# Patient Record
Sex: Male | Born: 1990
Health system: Southern US, Community
[De-identification: ages and names within clinical notes are randomized; demographics above are authoritative.]

## PROBLEM LIST (undated history)

## (undated) DIAGNOSIS — Q249 Congenital malformation of heart, unspecified: Secondary | ICD-10-CM

## (undated) DIAGNOSIS — F1010 Alcohol abuse, uncomplicated: Secondary | ICD-10-CM

## (undated) DIAGNOSIS — I5022 Chronic systolic (congestive) heart failure: Secondary | ICD-10-CM

## (undated) DIAGNOSIS — Z720 Tobacco use: Secondary | ICD-10-CM

## (undated) DIAGNOSIS — F1210 Cannabis abuse, uncomplicated: Secondary | ICD-10-CM

## (undated) DIAGNOSIS — R55 Syncope and collapse: Secondary | ICD-10-CM

---

## 2018-01-06 ENCOUNTER — Emergency Department (HOSPITAL_COMMUNITY)
Admission: EM | Admit: 2018-01-06 | Discharge: 2018-01-06 | Disposition: A | Discharge status: Home / Self Care | Payer: Self-pay | Attending: Emergency Medicine | Admitting: Emergency Medicine

## 2018-01-06 ENCOUNTER — Other Ambulatory Visit: Payer: Self-pay

## 2018-01-06 ENCOUNTER — Encounter (HOSPITAL_COMMUNITY): Payer: Self-pay | Admitting: *Deleted

## 2018-01-06 DIAGNOSIS — F17210 Nicotine dependence, cigarettes, uncomplicated: Secondary | ICD-10-CM | POA: Insufficient documentation

## 2018-01-06 DIAGNOSIS — R319 Hematuria, unspecified: Secondary | ICD-10-CM

## 2018-01-06 LAB — URINALYSIS, ROUTINE W REFLEX MICROSCOPIC
Bilirubin Urine: NEGATIVE
GLUCOSE, UA: NEGATIVE mg/dL
Hgb urine dipstick: NEGATIVE
KETONES UR: NEGATIVE mg/dL
LEUKOCYTES UA: NEGATIVE
Nitrite: NEGATIVE
PROTEIN: NEGATIVE mg/dL
Specific Gravity, Urine: 1.015 (ref 1.005–1.030)
pH: 7 (ref 5.0–8.0)

## 2018-01-06 NOTE — Discharge Instructions (Addendum)
Call for the remaining lab results in 3-5 days (send out tests). Your urine today appears normal, no signs of blood or infection in the urine at this time. Follow up with primary care, referral given, if symptoms return.

## 2018-01-06 NOTE — ED Provider Notes (Signed)
[facility] EMERGENCY DEPARTMENT Provider Note   CSN: [id number] Arrival date & time: 01/06/18  0911     History   Chief Complaint Chief Complaint  Patient presents with  . Hematuria    HPI Jackob Crookston is a 27 y.o. male.  27 year old male presents with complaint of blood in his urine today.  Patient states that he woke up around 630 this morning and went to the bathroom, states that he felt like he was "peeing razor blades" looked in the toilet and saw a few drops of blood in the commode.  She has urinated twice since then, no further episodes of blood.  He denies abdominal pain, flank pain, urethral discharge, urgency/frequency, any further dysuria, testicular pain or swelling, rashes or lesions.  Patient is in a monogamous relationship, significant other is here with patient today (pregnant), no concerns for STDs.     History reviewed. No pertinent past medical history.  There are no active problems to display for this patient.   History reviewed. No pertinent surgical history.      Home Medications    Prior to Admission medications   Not on File    Family History History reviewed. No pertinent family history.  Social History Social History   Tobacco Use  . Smoking status: Current Every Day Smoker  . Smokeless tobacco: Never Used  Substance Use Topics  . Alcohol use: Not on file  . Drug use: Not on file     Allergies   Patient has no known allergies.   Review of Systems Review of Systems  Constitutional: Negative for fever.  Gastrointestinal: Negative for abdominal pain, nausea and vomiting.  Genitourinary: Positive for dysuria and hematuria. Negative for decreased urine volume, difficulty urinating, discharge, frequency, genital sores, penile pain, penile swelling, scrotal swelling, testicular pain and urgency.  Musculoskeletal: Negative for back pain.  Skin: Negative for rash and wound.  Allergic/Immunologic: Negative for  immunocompromised state.  Hematological: Negative for adenopathy.  Psychiatric/Behavioral: Negative for confusion.  All other systems reviewed and are negative.    Physical Exam Updated Vital Signs BP 122/84 (BP Location: Right Arm)   Pulse 99   Temp 98.3 F (36.8 C) (Oral)   Resp 20   SpO2 99%   Physical Exam  Constitutional: He is oriented to person, place, and time. He appears well-developed and well-nourished. No distress.  HENT:  Head: Normocephalic and atraumatic.  Cardiovascular: Intact distal pulses.  Pulmonary/Chest: Effort normal.  Abdominal: Soft. There is no tenderness. There is no guarding and no CVA tenderness.  Musculoskeletal: He exhibits no tenderness.  Neurological: He is alert and oriented to person, place, and time.  Skin: Skin is warm and dry. He is not diaphoretic.  Psychiatric: He has a normal mood and affect. His behavior is normal.  Nursing note and vitals reviewed.    ED Treatments / Results  Labs (all labs ordered are listed, but only abnormal results are displayed) Labs Reviewed  URINALYSIS, ROUTINE W REFLEX MICROSCOPIC  GC/CHLAMYDIA PROBE AMP ([place]) NOT AT [facility]    EKG None  Radiology No results found.  Procedures Procedures (including critical care time)  Medications Ordered in ED Medications - No data to display   Initial Impression / Assessment and Plan / ED Course  I have reviewed the triage vital signs and the nursing notes.  Pertinent labs & imaging results that were available during my care of the patient were reviewed by me and considered in my medical decision making (  see chart for details).  Clinical Course as of Jan 06 1045  Wed Jan 06, 2018  [id number] 27 year old male presents with complaint of a few drops in his urine this morning with painful urination.  Denies any symptoms at this time.  Exam is unremarkable.  Gonorrhea and Chlamydia testing sent out, urinalysis today is normal.  Patient given referral to primary  care for follow-up.  Advised to call the hospital for his test results in the next 3 to 5 days.   [LM]    Clinical Course User Index [LM] Jeannie Fend, PA-C   Final Clinical Impressions(s) / ED Diagnoses   Final diagnoses:  Hematuria, unspecified type    ED Discharge Orders    None       Jeannie Fend, PA-C 01/06/18 1046    Terrilee Files, MD 01/06/18 1859

## 2018-01-06 NOTE — ED Notes (Signed)
Declined W/C at D/C and was escorted to lobby by RN. 

## 2018-01-06 NOTE — ED Triage Notes (Signed)
Pt in c/o blood in his urine that first started this morning, denies penile discharge, reports he did have some pain with urination the first time he urinated this morning but not the second time

## 2018-01-07 LAB — GC/CHLAMYDIA PROBE AMP (~~LOC~~) NOT AT ARMC
CHLAMYDIA, DNA PROBE: NEGATIVE
Neisseria Gonorrhea: NEGATIVE

## 2018-05-27 ENCOUNTER — Emergency Department (HOSPITAL_COMMUNITY): Payer: Self-pay

## 2018-05-27 ENCOUNTER — Inpatient Hospital Stay (HOSPITAL_COMMUNITY)
Admission: EM | Admit: 2018-05-27 | Discharge: 2018-06-02 | DRG: 287 | Disposition: A | Discharge status: Home / Self Care | Payer: Self-pay | Attending: Internal Medicine | Admitting: Internal Medicine

## 2018-05-27 ENCOUNTER — Encounter (HOSPITAL_COMMUNITY): Payer: Self-pay | Admitting: Emergency Medicine

## 2018-05-27 DIAGNOSIS — R0902 Hypoxemia: Secondary | ICD-10-CM

## 2018-05-27 DIAGNOSIS — Z7151 Drug abuse counseling and surveillance of drug abuser: Secondary | ICD-10-CM

## 2018-05-27 DIAGNOSIS — F1290 Cannabis use, unspecified, uncomplicated: Secondary | ICD-10-CM | POA: Diagnosis present

## 2018-05-27 DIAGNOSIS — N179 Acute kidney failure, unspecified: Secondary | ICD-10-CM | POA: Diagnosis present

## 2018-05-27 DIAGNOSIS — I4729 Other ventricular tachycardia: Secondary | ICD-10-CM

## 2018-05-27 DIAGNOSIS — I5021 Acute systolic (congestive) heart failure: Secondary | ICD-10-CM | POA: Diagnosis present

## 2018-05-27 DIAGNOSIS — Z716 Tobacco abuse counseling: Secondary | ICD-10-CM

## 2018-05-27 DIAGNOSIS — I426 Alcoholic cardiomyopathy: Secondary | ICD-10-CM | POA: Diagnosis present

## 2018-05-27 DIAGNOSIS — I472 Ventricular tachycardia: Secondary | ICD-10-CM | POA: Diagnosis present

## 2018-05-27 DIAGNOSIS — Q249 Congenital malformation of heart, unspecified: Secondary | ICD-10-CM

## 2018-05-27 DIAGNOSIS — F17200 Nicotine dependence, unspecified, uncomplicated: Secondary | ICD-10-CM | POA: Diagnosis present

## 2018-05-27 DIAGNOSIS — J810 Acute pulmonary edema: Secondary | ICD-10-CM

## 2018-05-27 DIAGNOSIS — R1032 Left lower quadrant pain: Secondary | ICD-10-CM

## 2018-05-27 DIAGNOSIS — Z789 Other specified health status: Secondary | ICD-10-CM

## 2018-05-27 DIAGNOSIS — Z7289 Other problems related to lifestyle: Secondary | ICD-10-CM

## 2018-05-27 DIAGNOSIS — I5023 Acute on chronic systolic (congestive) heart failure: Principal | ICD-10-CM | POA: Diagnosis present

## 2018-05-27 DIAGNOSIS — F1020 Alcohol dependence, uncomplicated: Secondary | ICD-10-CM | POA: Diagnosis present

## 2018-05-27 DIAGNOSIS — I081 Rheumatic disorders of both mitral and tricuspid valves: Secondary | ICD-10-CM | POA: Diagnosis present

## 2018-05-27 HISTORY — DX: Alcohol abuse, uncomplicated: F10.10

## 2018-05-27 HISTORY — DX: Chronic systolic (congestive) heart failure: I50.22

## 2018-05-27 HISTORY — DX: Syncope and collapse: R55

## 2018-05-27 HISTORY — DX: Tobacco use: Z72.0

## 2018-05-27 HISTORY — DX: Congenital malformation of heart, unspecified: Q24.9

## 2018-05-27 HISTORY — DX: Cannabis abuse, uncomplicated: F12.10

## 2018-05-27 LAB — LIPASE, BLOOD: Lipase: 34 U/L (ref 11–51)

## 2018-05-27 LAB — COMPREHENSIVE METABOLIC PANEL
ALK PHOS: 52 U/L (ref 38–126)
ALT: 53 U/L — ABNORMAL HIGH (ref 0–44)
AST: 29 U/L (ref 15–41)
Albumin: 3.4 g/dL — ABNORMAL LOW (ref 3.5–5.0)
Anion gap: 10 (ref 5–15)
BILIRUBIN TOTAL: 0.9 mg/dL (ref 0.3–1.2)
BUN: 14 mg/dL (ref 6–20)
CHLORIDE: 105 mmol/L (ref 98–111)
CO2: 21 mmol/L — AB (ref 22–32)
CREATININE: 1.46 mg/dL — AB (ref 0.61–1.24)
Calcium: 8.8 mg/dL — ABNORMAL LOW (ref 8.9–10.3)
GFR calc Af Amer: >60 mL/min (ref >60)
GFR calc non Af Amer: >60 mL/min (ref >60)
Glucose, Bld: 113 mg/dL — ABNORMAL HIGH (ref 70–99)
Potassium: 4.5 mmol/L (ref 3.5–5.1)
Sodium: 136 mmol/L (ref 135–145)
Total Protein: 6 g/dL — ABNORMAL LOW (ref 6.5–8.1)

## 2018-05-27 LAB — URINALYSIS, ROUTINE W REFLEX MICROSCOPIC
Bilirubin Urine: NEGATIVE
Glucose, UA: NEGATIVE mg/dL
Hgb urine dipstick: NEGATIVE
KETONES UR: NEGATIVE mg/dL
LEUKOCYTE UA: NEGATIVE
Nitrite: NEGATIVE
PH: 5 (ref 5.0–8.0)
Protein, ur: 30 mg/dL — AB
Specific Gravity, Urine: 1.029 (ref 1.005–1.030)

## 2018-05-27 LAB — CBC
HEMATOCRIT: 43.5 % (ref 39.0–52.0)
Hemoglobin: 14.6 g/dL (ref 13.0–17.0)
MCH: 29.7 pg (ref 26.0–34.0)
MCHC: 33.6 g/dL (ref 30.0–36.0)
MCV: 88.4 fL (ref 80.0–100.0)
NRBC: 0 % (ref 0.0–0.2)
Platelets: 213 10*3/uL (ref 150–400)
RBC: 4.92 MIL/uL (ref 4.22–5.81)
RDW: 12.9 % (ref 11.5–15.5)
WBC: 10.6 10*3/uL — ABNORMAL HIGH (ref 4.0–10.5)

## 2018-05-27 MED ORDER — FUROSEMIDE 10 MG/ML IJ SOLN
60.0000 mg | Freq: Once | INTRAMUSCULAR | Status: AC
  Administered 2018-05-27: 60 mg via INTRAVENOUS
  Filled 2018-05-27: qty 6

## 2018-05-27 MED ORDER — ONDANSETRON HCL 4 MG/2ML IJ SOLN
4.0000 mg | Freq: Once | INTRAMUSCULAR | Status: AC
  Administered 2018-05-27: 4 mg via INTRAVENOUS
  Filled 2018-05-27: qty 2

## 2018-05-27 MED ORDER — MORPHINE SULFATE (PF) 4 MG/ML IV SOLN
4.0000 mg | Freq: Once | INTRAVENOUS | Status: AC
  Administered 2018-05-27: 4 mg via INTRAVENOUS
  Filled 2018-05-27: qty 1

## 2018-05-27 MED ORDER — IOHEXOL 300 MG/ML  SOLN
100.0000 mL | Freq: Once | INTRAMUSCULAR | Status: AC | PRN
  Administered 2018-05-27: 100 mL via INTRAVENOUS

## 2018-05-27 MED ORDER — SODIUM CHLORIDE 0.9 % IV BOLUS
500.0000 mL | Freq: Once | INTRAVENOUS | Status: AC
  Administered 2018-05-27: 22:00:00 via INTRAVENOUS

## 2018-05-27 MED ORDER — DICYCLOMINE HCL 10 MG PO CAPS
10.0000 mg | ORAL_CAPSULE | Freq: Once | ORAL | Status: AC
  Administered 2018-05-27: 10 mg via ORAL
  Filled 2018-05-27: qty 1

## 2018-05-27 MED ORDER — SODIUM CHLORIDE 0.9% FLUSH: 3.0000 mL | Freq: Once | INTRAVENOUS | Status: DC

## 2018-05-27 NOTE — ED Notes (Signed)
Patient transported to CT scan . 

## 2018-05-27 NOTE — ED Triage Notes (Signed)
Pt reports a "mean stomach cramp" that has comes and goes for the past 4 days.  Denies n/v/d.  Pain makes me "bend over and stay".

## 2018-05-27 NOTE — ED Provider Notes (Signed)
Emergency Department Provider Note   I have reviewed the triage vital signs and the nursing notes.   HISTORY  Chief Complaint Abdominal Pain   HPI Bruce Morse is a 28 y.o. male presents to the emergency department for evaluation of intermittent, lower abdominal pain.  Patient describes severe pain which begins without provocation.  He has had some bowel movements which are more softer than normal but denies diarrhea.  No blood in the stool.  No nausea or vomiting.  Denies fevers or chills.  He reports feeling like "I am getting punched in the gut."  He is having pain currently.  No similar pain in the past.  Denies chest pain or shortness of breath.   History reviewed. No pertinent past medical history.  Patient Active Problem List   Diagnosis Date Noted  . Acute CHF (congestive heart failure) (HCC) 05/28/2018  . Left lower quadrant abdominal pain 05/28/2018  . Acute pulmonary edema (HCC) 05/28/2018    History reviewed. No pertinent surgical history.  Allergies Patient has no known allergies.  Family History  Problem Relation Age of Onset  . CAD Neg Hx   . Diabetes Mellitus II Neg Hx     Social History Social History   Tobacco Use  . Smoking status: Current Every Day Smoker  . Smokeless tobacco: Never Used  Substance Use Topics  . Alcohol use: Yes    Comment: Daily.  . Drug use: Not on file    Review of Systems  Constitutional: No fever/chills Eyes: No visual changes. ENT: No sore throat. Cardiovascular: Denies chest pain. Respiratory: Denies shortness of breath. Gastrointestinal: Positive abdominal pain.  No nausea, no vomiting.  No diarrhea.  No constipation. Genitourinary: Negative for dysuria. Musculoskeletal: Negative for back pain. Skin: Negative for rash. Neurological: Negative for headaches, focal weakness or numbness.  10-point ROS otherwise negative.  ____________________________________________   PHYSICAL EXAM:  VITAL SIGNS: ED  Triage Vitals  Enc Vitals Group     BP 05/27/18 2003 116/71     Pulse Rate 05/27/18 2003 (!) 130     Resp 05/27/18 2003 18     Temp 05/27/18 2003 98.3 F (36.8 C)     Temp Source 05/27/18 2003 Oral     SpO2 05/27/18 2003 99 %     Weight 05/27/18 2010 160 lb (72.6 kg)     Height 05/27/18 2010  (1.727 m)     Pain Score 05/27/18 2010 10   onstitutional: Alert and oriented. Well appearing and in no acute distress. Eyes: Conjunctivae are normal.  Head: Atraumatic. Nose: No congestion/rhinnorhea. Mouth/Throat: Mucous membranes are moist.  Neck: No stridor.   Cardiovascular: Normal rate, regular rhythm. Good peripheral circulation. Grossly normal heart sounds.   Respiratory: Normal respiratory effort.  No retractions. Lungs CTAB. Gastrointestinal: Soft with focal LLQ tenderness to palpation with voluntary guarding. No rebound. No distention.  Musculoskeletal: No lower extremity tenderness nor edema. No gross deformities of extremities. Neurologic:  Normal speech and language. No gross focal neurologic deficits are appreciated.  Skin:  Skin is warm, dry and intact. No rash noted.  ____________________________________________   LABS (all labs ordered are listed, but only abnormal results are displayed)  Labs Reviewed  COMPREHENSIVE METABOLIC PANEL - Abnormal; Notable for the following components:      Result Value   CO2 21 (*)    Glucose, Bld 113 (*)    Creatinine, Ser 1.46 (*)    Calcium 8.8 (*)    Total Protein 6.0 (*)  Albumin 3.4 (*)    ALT 53 (*)    All other components within normal limits  CBC - Abnormal; Notable for the following components:   WBC 10.6 (*)    All other components within normal limits  URINALYSIS, ROUTINE W REFLEX MICROSCOPIC - Abnormal; Notable for the following components:   Color, Urine AMBER (*)    Protein, ur 30 (*)    Bacteria, UA RARE (*)    All other components within normal limits  BRAIN NATRIURETIC PEPTIDE - Abnormal; Notable for the  following components:   B Natriuretic Peptide 1,232.5 (*)    All other components within normal limits  RAPID URINE DRUG SCREEN, HOSP PERFORMED - Abnormal; Notable for the following components:   Opiates POSITIVE (*)    Tetrahydrocannabinol POSITIVE (*)    All other components within normal limits  BASIC METABOLIC PANEL - Abnormal; Notable for the following components:   CO2 18 (*)    Glucose, Bld 126 (*)    Creatinine, Ser 1.45 (*)    All other components within normal limits  LIPASE, BLOOD  TROPONIN I  TSH  CBC  MAGNESIUM  TROPONIN I  HIV ANTIBODY (ROUTINE TESTING W REFLEX)  TROPONIN I   ____________________________________________  RADIOLOGY  Dg Chest 2 View  Result Date: 05/27/2018 CLINICAL DATA:  Stomach pain EXAM: CHEST - 2 VIEW COMPARISON:  CT 05/27/2018 FINDINGS: Cardiomegaly with vascular congestion and bilateral interstitial and ground-glass opacity consistent with edema. No significant pleural effusion. No pneumothorax. IMPRESSION: Cardiomegaly with vascular congestion and diffuse bilateral interstitial and ground-glass opacity consistent with pulmonary edema Electronically Signed   By: Jasmine Pang M.D.   On: 05/27/2018 23:14   Ct Abdomen Pelvis W Contrast  Result Date: 05/27/2018 CLINICAL DATA:  Abdominal pain. EXAM: CT ABDOMEN AND PELVIS WITH CONTRAST TECHNIQUE: Multidetector CT imaging of the abdomen and pelvis was performed using the standard protocol following bolus administration of intravenous contrast. CONTRAST:  OMNIPAQUE IOHEXOL 300 MG/ML  SOLN COMPARISON:  None. FINDINGS: Lower chest: Geographic ground-glass opacities in the bases. Mild smooth septal thickening. Heart is prominent size for age. Hepatobiliary: No focal hepatic lesion, however examination is performed on arterial phase imaging which limits detailed parenchymal assessment. Gallbladder partially distended, no calcified stone. No biliary dilatation. Pancreas: No ductal dilatation or inflammation.  Spleen: Normal in size without focal abnormality. Adrenals/Urinary Tract: Normal adrenal glands. No hydronephrosis or perinephric edema. Homogeneous renal enhancement. Urinary bladder is nondistended and well evaluated. Stomach/Bowel: Bowel evaluation is limited in the absence of enteric contrast and paucity of intra-abdominal fat. Stomach is nondistended. Small amount of high-density ingested material in the stomach and duodenum. No small bowel dilatation, inflammation, or obstruction. Normal appendix, for example images 58-61 series 3. Moderate stool in the proximal colon the remainder the colon is decompressed which limits assessment, no obvious colonic wall thickening. No pericolonic inflammation. No visualize colonic diverticula. Vascular/Lymphatic: Imaging obtained in arterial phase. Abdominal aorta is normal in caliber. No adenopathy. Reproductive: Prostate is unremarkable. Other: No free air or free fluid. Musculoskeletal: There are no acute or suspicious osseous abnormalities. IMPRESSION: 1. No acute abnormality in the abdomen/pelvis. 2. Geographic ground-glass opacities in the lung bases with smooth septal thickening. Findings suggest pulmonary edema. Recommend correlation with chest radiograph. Electronically Signed   By: Narda Rutherford M.D.   On: 05/27/2018 22:22    ____________________________________________   PROCEDURES  Procedure(s) performed:   Procedures  CRITICAL CARE Performed by: Maia Plan Total critical care time: 35 minutes Critical  care time was exclusive of separately billable procedures and treating other patients. Critical care was necessary to treat or prevent imminent or life-threatening deterioration. Critical care was time spent personally by me on the following activities: development of treatment plan with patient and/or surrogate as well as nursing, discussions with consultants, evaluation of patient's response to treatment, examination of patient, obtaining  history from patient or surrogate, ordering and performing treatments and interventions, ordering and review of laboratory studies, ordering and review of radiographic studies, pulse oximetry and re-evaluation of patient's condition.  Alona Bene, MD Emergency Medicine  ____________________________________________   INITIAL IMPRESSION / ASSESSMENT AND PLAN / ED COURSE  Pertinent labs & imaging results that were available during my care of the patient were reviewed by me and considered in my medical decision making (see chart for details).  Patient presents to the emergency department for evaluation of left lower quadrant abdominal pain.  He has tenderness on exam.  Describes pain as 10/10.  Mild leukocytosis here.  Plan for IV fluids, pain/nausea medication, CT abdomen pelvis evaluate for possible diverticulitis/colitis.  No testicular pain or swelling.  Doubt torsion.  Lower suspicion for ureteral stone given reproducible tenderness on exam.   Labs are largely unremarkable.  CT scan shows no acute intra-abdominal process.  Does show some evidence of pulmonary edema on lower lung fields.  Recommends chest x-ray which was ordered.  I back to question the patient.  He is not having significant shortness of breath but states that as a child he was diagnosed with a "enlarged heart."  He has not been advised to follow closely with cardiology.  Denies chest pain.  Significant other at bedside states that he does occasionally wake up at night short of breath but that this has not been a regular occurrence.  He denies any lower extremity swelling.  BNP 1,200 with normal troponin. Plan for hospitalist admit for diuresis and ECHO.   Discussed patient's case with Hospitalist, Dr. Toniann Fail to request admission. Patient and family (if present) updated with plan. Care transferred to Hospitalist service.  I reviewed all nursing notes, vitals, pertinent old records, EKGs, labs, imaging (as  available).  ____________________________________________  FINAL CLINICAL IMPRESSION(S) / ED DIAGNOSES  Final diagnoses:  Left lower quadrant abdominal pain  Hypoxemia  Acute pulmonary edema (HCC)     MEDICATIONS GIVEN DURING THIS VISIT:  Medications  acetaminophen (TYLENOL) tablet 650 mg (650 mg Oral Given 05/28/18 0345)    Or  acetaminophen (TYLENOL) suppository 650 mg ( Rectal See Alternative 05/28/18 0345)  ondansetron (ZOFRAN) tablet 4 mg ( Oral See Alternative 05/28/18 0345)    Or  ondansetron (ZOFRAN) injection 4 mg (4 mg Intravenous Given 05/28/18 0345)  LORazepam (ATIVAN) tablet 1 mg (has no administration in time range)    Or  LORazepam (ATIVAN) injection 1 mg (has no administration in time range)  thiamine (VITAMIN B-1) tablet 100 mg (100 mg Oral Given 05/28/18 0925)    Or  thiamine (B-1) injection 100 mg ( Intravenous See Alternative 05/28/18 0925)  folic acid (FOLVITE) tablet 1 mg (1 mg Oral Given 05/28/18 0925)  multivitamin with minerals tablet 1 tablet (1 tablet Oral Given 05/28/18 0925)  furosemide (LASIX) injection 40 mg (40 mg Intravenous Given 05/28/18 0925)  pantoprazole (PROTONIX) EC tablet 40 mg (40 mg Oral Given 05/28/18 0925)  morphine 4 MG/ML injection 4 mg (4 mg Intravenous Given 05/27/18 2211)  ondansetron (ZOFRAN) injection 4 mg (4 mg Intravenous Given 05/27/18 2211)  sodium chloride 0.9 %  bolus 500 mL (0 mLs Intravenous Stopped 05/27/18 2305)  iohexol (OMNIPAQUE) 300 MG/ML solution 100 mL (100 mLs Intravenous Contrast Given 05/27/18 2156)  dicyclomine (BENTYL) capsule 10 mg (10 mg Oral Given 05/27/18 2310)  furosemide (LASIX) injection 60 mg (60 mg Intravenous Given 05/27/18 2334)  metoprolol tartrate (LOPRESSOR) injection 5 mg (5 mg Intravenous Given 05/28/18 0323)    Note:  This document was prepared using Dragon voice recognition software and may include unintentional dictation errors.  Alona Bene, MD Emergency Medicine    , Arlyss Repress, MD 05/28/18 1106

## 2018-05-27 NOTE — ED Notes (Signed)
Pt. Transported to xray 

## 2018-05-28 ENCOUNTER — Inpatient Hospital Stay (HOSPITAL_COMMUNITY): Payer: Self-pay

## 2018-05-28 ENCOUNTER — Encounter (HOSPITAL_COMMUNITY): Payer: Self-pay | Admitting: Internal Medicine

## 2018-05-28 DIAGNOSIS — J810 Acute pulmonary edema: Secondary | ICD-10-CM

## 2018-05-28 DIAGNOSIS — I340 Nonrheumatic mitral (valve) insufficiency: Secondary | ICD-10-CM

## 2018-05-28 DIAGNOSIS — I5021 Acute systolic (congestive) heart failure: Secondary | ICD-10-CM | POA: Diagnosis present

## 2018-05-28 DIAGNOSIS — I361 Nonrheumatic tricuspid (valve) insufficiency: Secondary | ICD-10-CM

## 2018-05-28 DIAGNOSIS — R1032 Left lower quadrant pain: Secondary | ICD-10-CM | POA: Diagnosis present

## 2018-05-28 DIAGNOSIS — I509 Heart failure, unspecified: Secondary | ICD-10-CM

## 2018-05-28 LAB — HIV ANTIBODY (ROUTINE TESTING W REFLEX): HIV Screen 4th Generation wRfx: NONREACTIVE

## 2018-05-28 LAB — CBC
HCT: 46.1 % (ref 39.0–52.0)
HEMOGLOBIN: 14.9 g/dL (ref 13.0–17.0)
MCH: 28.7 pg (ref 26.0–34.0)
MCHC: 32.3 g/dL (ref 30.0–36.0)
MCV: 88.7 fL (ref 80.0–100.0)
Platelets: 205 10*3/uL (ref 150–400)
RBC: 5.2 MIL/uL (ref 4.22–5.81)
RDW: 12.9 % (ref 11.5–15.5)
WBC: 9.1 10*3/uL (ref 4.0–10.5)
nRBC: 0 % (ref 0.0–0.2)

## 2018-05-28 LAB — TROPONIN I
Troponin I: <0.03 ng/mL (ref <0.03)
Troponin I: <0.03 ng/mL (ref <0.03)
Troponin I: <0.03 ng/mL (ref <0.03)

## 2018-05-28 LAB — BASIC METABOLIC PANEL
Anion gap: 10 (ref 5–15)
BUN: 15 mg/dL (ref 6–20)
CO2: 18 mmol/L — ABNORMAL LOW (ref 22–32)
Calcium: 8.9 mg/dL (ref 8.9–10.3)
Chloride: 107 mmol/L (ref 98–111)
Creatinine, Ser: 1.45 mg/dL — ABNORMAL HIGH (ref 0.61–1.24)
GFR calc Af Amer: >60 mL/min (ref >60)
GFR calc non Af Amer: >60 mL/min (ref >60)
Glucose, Bld: 126 mg/dL — ABNORMAL HIGH (ref 70–99)
POTASSIUM: 4.4 mmol/L (ref 3.5–5.1)
Sodium: 135 mmol/L (ref 135–145)

## 2018-05-28 LAB — MAGNESIUM: Magnesium: 2 mg/dL (ref 1.7–2.4)

## 2018-05-28 LAB — TSH: TSH: 3.393 u[IU]/mL (ref 0.350–4.500)

## 2018-05-28 LAB — RAPID URINE DRUG SCREEN, HOSP PERFORMED
AMPHETAMINES: NOT DETECTED
BARBITURATES: NOT DETECTED
BENZODIAZEPINES: NOT DETECTED
COCAINE: NOT DETECTED
Opiates: POSITIVE — AB
Tetrahydrocannabinol: POSITIVE — AB

## 2018-05-28 LAB — ECHOCARDIOGRAM COMPLETE
Height: 68 in
Weight: 2796.8 oz

## 2018-05-28 LAB — BRAIN NATRIURETIC PEPTIDE: B NATRIURETIC PEPTIDE 5: 1232.5 pg/mL — AB (ref 0.0–100.0)

## 2018-05-28 MED ORDER — LORAZEPAM 2 MG/ML IJ SOLN: 1.0000 mg | Freq: Four times a day (QID) | INTRAMUSCULAR | Status: DC | PRN

## 2018-05-28 MED ORDER — DIGOXIN 125 MCG PO TABS
0.2500 mg | ORAL_TABLET | Freq: Every day | ORAL | Status: DC
  Administered 2018-05-28 – 2018-06-02 (×6): 0.25 mg via ORAL
  Filled 2018-05-28 (×6): qty 2

## 2018-05-28 MED ORDER — SACUBITRIL-VALSARTAN 24-26 MG PO TABS
1.0000 | ORAL_TABLET | Freq: Two times a day (BID) | ORAL | Status: DC
  Filled 2018-05-28: qty 1

## 2018-05-28 MED ORDER — ONDANSETRON HCL 4 MG PO TABS: 4.0000 mg | ORAL_TABLET | Freq: Four times a day (QID) | ORAL | Status: DC | PRN

## 2018-05-28 MED ORDER — PANTOPRAZOLE SODIUM 40 MG PO TBEC
40.0000 mg | DELAYED_RELEASE_TABLET | Freq: Every day | ORAL | Status: DC
  Administered 2018-05-28 – 2018-06-02 (×6): 40 mg via ORAL
  Filled 2018-05-28 (×6): qty 1

## 2018-05-28 MED ORDER — FOLIC ACID 1 MG PO TABS
1.0000 mg | ORAL_TABLET | Freq: Every day | ORAL | Status: DC
  Administered 2018-05-28 – 2018-06-02 (×6): 1 mg via ORAL
  Filled 2018-05-28 (×7): qty 1

## 2018-05-28 MED ORDER — VITAMIN B-1 100 MG PO TABS
100.0000 mg | ORAL_TABLET | Freq: Every day | ORAL | Status: DC
  Administered 2018-05-28 – 2018-06-01 (×5): 100 mg via ORAL
  Filled 2018-05-28 (×8): qty 1

## 2018-05-28 MED ORDER — ACETAMINOPHEN 650 MG RE SUPP: 650.0000 mg | Freq: Four times a day (QID) | RECTAL | Status: DC | PRN

## 2018-05-28 MED ORDER — LOSARTAN POTASSIUM 25 MG PO TABS
25.0000 mg | ORAL_TABLET | Freq: Every day | ORAL | Status: DC
  Administered 2018-05-28: 25 mg via ORAL
  Filled 2018-05-28: qty 1

## 2018-05-28 MED ORDER — SPIRONOLACTONE 12.5 MG HALF TABLET
12.5000 mg | ORAL_TABLET | Freq: Every day | ORAL | Status: DC
  Administered 2018-05-28 – 2018-06-02 (×6): 12.5 mg via ORAL
  Filled 2018-05-28 (×7): qty 1

## 2018-05-28 MED ORDER — METOPROLOL TARTRATE 5 MG/5ML IV SOLN
5.0000 mg | Freq: Once | INTRAVENOUS | Status: AC
  Administered 2018-05-28: 5 mg via INTRAVENOUS
  Filled 2018-05-28: qty 5

## 2018-05-28 MED ORDER — ACETAMINOPHEN 325 MG PO TABS
650.0000 mg | ORAL_TABLET | Freq: Four times a day (QID) | ORAL | Status: DC | PRN
  Administered 2018-05-28: 650 mg via ORAL
  Filled 2018-05-28: qty 2

## 2018-05-28 MED ORDER — PERFLUTREN LIPID MICROSPHERE
1.0000 mL | INTRAVENOUS | Status: AC | PRN
  Administered 2018-05-28: 2 mL via INTRAVENOUS
  Filled 2018-05-28: qty 10

## 2018-05-28 MED ORDER — ADULT MULTIVITAMIN W/MINERALS CH
1.0000 | ORAL_TABLET | Freq: Every day | ORAL | Status: DC
  Administered 2018-05-28 – 2018-06-02 (×6): 1 via ORAL
  Filled 2018-05-28 (×7): qty 1

## 2018-05-28 MED ORDER — FUROSEMIDE 10 MG/ML IJ SOLN
40.0000 mg | Freq: Two times a day (BID) | INTRAMUSCULAR | Status: DC
  Administered 2018-05-28 (×2): 40 mg via INTRAVENOUS
  Filled 2018-05-28 (×3): qty 4

## 2018-05-28 MED ORDER — THIAMINE HCL 100 MG/ML IJ SOLN
100.0000 mg | Freq: Every day | INTRAMUSCULAR | Status: DC
  Filled 2018-05-28 (×3): qty 2

## 2018-05-28 MED ORDER — ONDANSETRON HCL 4 MG/2ML IJ SOLN
4.0000 mg | Freq: Four times a day (QID) | INTRAMUSCULAR | Status: DC | PRN
  Administered 2018-05-28: 4 mg via INTRAVENOUS
  Filled 2018-05-28: qty 2

## 2018-05-28 MED ORDER — LORAZEPAM 1 MG PO TABS: 1.0000 mg | ORAL_TABLET | Freq: Four times a day (QID) | ORAL | Status: DC | PRN

## 2018-05-28 NOTE — Plan of Care (Signed)
  Problem: Education: Goal: Knowledge of General Education information will improve Description Including pain rating scale, medication(s)/side effects and non-pharmacologic comfort measures Outcome: Progressing   Problem: Clinical Measurements: Goal: Will remain free from infection Outcome: Progressing   Problem: Activity: Goal: Risk for activity intolerance will decrease Outcome: Progressing   Problem: Nutrition: Goal: Adequate nutrition will be maintained Outcome: Progressing   Problem: Coping: Goal: Level of anxiety will decrease Outcome: Progressing   Problem: Pain Managment: Goal: General experience of comfort will improve Outcome: Progressing   Problem: Education: Goal: Ability to demonstrate management of disease process will improve Outcome: Progressing   Problem: Education: Goal: Ability to verbalize understanding of medication therapies will improve Outcome: Progressing

## 2018-05-28 NOTE — ED Notes (Addendum)
ED TO INPATIENT HANDOFF REPORT  ED Nurse Name and Phone #:  Lucious Groves [phone]  S Name/Age/Gender Bruce Morse 28 y.o. male Room/Bed: 033C/033C  Code Status FULL CODE  Home/SNF/Other  Home {Patient oriented to: PERSON / PLACE/ TIME/SITUATION  Is this baseline? YES  Triage Complete: Triage complete  Chief Complaint ABDOMINAL PAIN   Triage Note Pt reports a "mean stomach cramp" that has comes and goes for the past 4 days.  Denies n/v/d.  Pain makes me "bend over and stay".     Allergies No Known Allergies  Level of Care/Admitting Diagnosis ED Disposition    ED Disposition Condition Comment   Admit  Hospital Area: [facility] [[id number]]  Level of Care: Cardiac Telemetry [103]  Diagnosis: Acute CHF (congestive heart failure) [facility]) [[id number]]  Admitting Physician: Eduard Clos [phone]  Attending Physician: Eduard Clos [phone]  Estimated length of stay: past midnight tomorrow  Certification:: I certify this patient will need inpatient services for at least 2 midnights  PT Class (Do Not Modify): Inpatient [101]  PT Acc Code (Do Not Modify): Private [1]       B Medical/Surgery History History reviewed. No pertinent past medical history. History reviewed. No pertinent surgical history.   A IV Location/Drains/Wounds Patient Lines/Drains/Airways Status   Active Line/Drains/Airways    Name:   Placement date:   Placement time:   Site:   Days:   Peripheral IV 05/27/18 Left Antecubital   05/27/18    2126    Antecubital   1          Intake/Output Last 24 hours  Intake/Output Summary (Last 24 hours) at 05/28/2018 0020 Last data filed at 05/27/2018 2359 Gross per 24 hour  Intake -  Output 200 ml  Net -200 ml    Labs/Imaging Results for orders placed or performed during the hospital encounter of 05/27/18 (from the past 48 hour(s))  Urinalysis, Routine w reflex microscopic     Status: Abnormal   Collection Time: 05/27/18  8:12 PM   Result Value Ref Range   Color, Urine AMBER (A) YELLOW    Comment: BIOCHEMICALS MAY BE AFFECTED BY COLOR   APPearance CLEAR CLEAR   Specific Gravity, Urine 1.029 1.005 - 1.030   pH 5.0 5.0 - 8.0   Glucose, UA NEGATIVE NEGATIVE mg/dL   Hgb urine dipstick NEGATIVE NEGATIVE   Bilirubin Urine NEGATIVE NEGATIVE   Ketones, ur NEGATIVE NEGATIVE mg/dL   Protein, ur 30 (A) NEGATIVE mg/dL   Nitrite NEGATIVE NEGATIVE   Leukocytes,Ua NEGATIVE NEGATIVE   RBC / HPF 0-5 0 - 5 RBC/hpf   WBC, UA 0-5 0 - 5 WBC/hpf   Bacteria, UA RARE (A) NONE SEEN   Mucus PRESENT     Comment: Performed at [facility], [address] [place], [place] [address]  Lipase, blood     Status: None   Collection Time: 05/27/18  8:17 PM  Result Value Ref Range   Lipase 34 11 - 51 U/L    Comment: Performed at [facility], [address], [place], [place] [address]  Comprehensive metabolic panel     Status: Abnormal   Collection Time: 05/27/18  8:17 PM  Result Value Ref Range   Sodium 136 135 - 145 mmol/L   Potassium 4.5 3.5 - 5.1 mmol/L   Chloride 105 98 - 111 mmol/L   CO2 21 (L) 22 - 32 mmol/L   Glucose, Bld 113 (H) 70 - 99 mg/dL  BUN 14 6 - 20 mg/dL   Creatinine, Ser 2.08 (H) 0.61 - 1.24 mg/dL   Calcium 8.8 (L) 8.9 - 10.3 mg/dL   Total Protein 6.0 (L) 6.5 - 8.1 g/dL   Albumin 3.4 (L) 3.5 - 5.0 g/dL   AST 29 15 - 41 U/L   ALT 53 (H) 0 - 44 U/L   Alkaline Phosphatase 52 38 - 126 U/L   Total Bilirubin 0.9 0.3 - 1.2 mg/dL   GFR calc non Af Amer >60 >60 mL/min   GFR calc Af Amer >60 >60 mL/min   Anion gap 10 5 - 15    Comment: Performed at [facility], [address] [place], [place] [address]  CBC     Status: Abnormal   Collection Time: 05/27/18  8:17 PM  Result Value Ref Range   WBC 10.6 (H) 4.0 - 10.5 K/uL   RBC 4.92 4.22 - 5.81 MIL/uL   Hemoglobin 14.6 13.0 - 17.0 g/dL   HCT 19.5 97.4 - 71.8 %   MCV 88.4 80.0 - 100.0 fL   MCH 29.7 26.0 - 34.0 pg   MCHC 33.6 30.0 - 36.0 g/dL   RDW  55.0 15.8 - 68.2 %   Platelets 213 150 - 400 K/uL   nRBC 0.0 0.0 - 0.2 %    Comment: Performed at [facility], [address], [place], [place] [address]   Dg Chest 2 View  Result Date: 05/27/2018 CLINICAL DATA:  Stomach pain EXAM: CHEST - 2 VIEW COMPARISON:  CT 05/27/2018 FINDINGS: Cardiomegaly with vascular congestion and bilateral interstitial and ground-glass opacity consistent with edema. No significant pleural effusion. No pneumothorax. IMPRESSION: Cardiomegaly with vascular congestion and diffuse bilateral interstitial and ground-glass opacity consistent with pulmonary edema Electronically Signed   By: Jasmine Pang M.D.   On: 05/27/2018 23:14   Ct Abdomen Pelvis W Contrast  Result Date: 05/27/2018 CLINICAL DATA:  Abdominal pain. EXAM: CT ABDOMEN AND PELVIS WITH CONTRAST TECHNIQUE: Multidetector CT imaging of the abdomen and pelvis was performed using the standard protocol following bolus administration of intravenous contrast. CONTRAST:  OMNIPAQUE IOHEXOL 300 MG/ML  SOLN COMPARISON:  None. FINDINGS: Lower chest: Geographic ground-glass opacities in the bases. Mild smooth septal thickening. Heart is prominent size for age. Hepatobiliary: No focal hepatic lesion, however examination is performed on arterial phase imaging which limits detailed parenchymal assessment. Gallbladder partially distended, no calcified stone. No biliary dilatation. Pancreas: No ductal dilatation or inflammation. Spleen: Normal in size without focal abnormality. Adrenals/Urinary Tract: Normal adrenal glands. No hydronephrosis or perinephric edema. Homogeneous renal enhancement. Urinary bladder is nondistended and well evaluated. Stomach/Bowel: Bowel evaluation is limited in the absence of enteric contrast and paucity of intra-abdominal fat. Stomach is nondistended. Small amount of high-density ingested material in the stomach and duodenum. No small bowel dilatation, inflammation, or obstruction. Normal appendix,  for example images 58-61 series 3. Moderate stool in the proximal colon the remainder the colon is decompressed which limits assessment, no obvious colonic wall thickening. No pericolonic inflammation. No visualize colonic diverticula. Vascular/Lymphatic: Imaging obtained in arterial phase. Abdominal aorta is normal in caliber. No adenopathy. Reproductive: Prostate is unremarkable. Other: No free air or free fluid. Musculoskeletal: There are no acute or suspicious osseous abnormalities. IMPRESSION: 1. No acute abnormality in the abdomen/pelvis. 2. Geographic ground-glass opacities in the lung bases with smooth septal thickening. Findings suggest pulmonary edema. Recommend correlation with chest radiograph. Electronically Signed   By: Narda Rutherford M.D.   On:  05/27/2018 22:22    Pending Labs Unresulted Labs (From admission, onward)    Start     Ordered   05/27/18 2354  TSH  ONCE - STAT,   STAT     05/27/18 2353   05/27/18 2353  Brain natriuretic peptide  Once,   STAT     05/27/18 2352   05/27/18 2353  Troponin I - Once  Once,   STAT     05/27/18 2352   05/27/18 2353  Urine rapid drug screen (hosp performed)  ONCE - STAT,   STAT     05/27/18 2352          Vitals/Pain Today's Vitals   05/27/18 2330 05/27/18 2335 05/27/18 2336 05/27/18 2345  BP: 99/78 99/78  101/78  Pulse: (!) 119 (!) 121  (!) 126  Resp:  18    Temp:      TempSrc:      SpO2: 96% 98%  97%  Weight:      Height:      PainSc:  0-No pain 0-No pain     Isolation Precautions No active isolations  Medications Medications  sodium chloride flush (NS) 0.9 % injection 3 mL (3 mLs Intravenous Not Given 05/27/18 2126)  morphine 4 MG/ML injection 4 mg (4 mg Intravenous Given 05/27/18 2211)  ondansetron (ZOFRAN) injection 4 mg (4 mg Intravenous Given 05/27/18 2211)  sodium chloride 0.9 % bolus 500 mL (0 mLs Intravenous Stopped 05/27/18 2305)  iohexol (OMNIPAQUE) 300 MG/ML solution 100 mL (100 mLs Intravenous Contrast Given 05/27/18  2156)  dicyclomine (BENTYL) capsule 10 mg (10 mg Oral Given 05/27/18 2310)  furosemide (LASIX) injection 60 mg (60 mg Intravenous Given 05/27/18 2334)    Mobility walks Low fall risk   Focused Assessments  NO ABDOMINAL PAIN AT THIS TIME , NO EMESIS OR DIARRHEA  WHILE AT ER.    R Recommendations: See Admitting Provider Note  Report given to:   Additional Notes:  FAMILY AT BEDSIDE ; IV SITE INTACT. NASAL CANNULA 2 LPM, TACHYCARDIC =120'S. DENIES CHEST PAIN OR PALPITATIONS .

## 2018-05-28 NOTE — H&P (Signed)
History and Physical    Bruce Morse GMW:[id number] DOB: 03-08-1991 DOA: 05/27/2018  PCP: Patient, No Pcp Per  Patient coming from: Home.  Chief Complaint: Abdominal pain.  HPI: Bruce Morse is a 28 y.o. 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  The same time patient has been also having shortness of breath on exertion and orthopnea for the same duration of time.  Denies any chest pain productive cough fever chills.  Denies any nausea vomiting or diarrhea.  Abdominal pain is not related to food or defecation.  No weight loss or any blood in the stools.  Patient has not noticed any swelling in the legs.  Patient states that he frequently wakes up in the sleep from shortness of breath last few days.  ED Course: In the ER CT scan of the abdomen and pelvis done shows features concerning for acute pulmonary edema this was confirmed with chest x-ray shows cardiomegaly..  Lipase LFTs were largely normal.  Creatinine 1.4.  Slightly elevated WBC.  UA shows mild proteinuria.  Patient was found to be tachycardic.  An EKG shows sinus tachycardia.  Lasix 60 mg IV was given.  Patient admitted for acute CHF unknown EF.  Review of Systems: As per HPI, rest all negative.   History reviewed. No pertinent past medical history.  History reviewed. No pertinent surgical history.   reports that he has been smoking. He has never used smokeless tobacco. He reports current alcohol use. No history on file for drug.  No Known Allergies  Family History  Problem Relation Age of Onset  . CAD Neg Hx   . Diabetes Mellitus II Neg Hx     Prior to Admission medications   Not on File    Physical Exam: Vitals:   05/27/18 2345 05/28/18 0015 05/28/18 0059 05/28/18 0107  BP: 101/78 122/84 106/77   Pulse: (!) 126 (!) 123 (!) 130     Resp:  18 20   Temp:   (!) 97.5 F (36.4 C)   TempSrc:   Oral   SpO2: 97% 98% 98%   Weight:    79.3 kg  Height:    5\' 8"  (1.727 m)      Constitutional: Moderately built and nourished. Vitals:   05/27/18 2345 05/28/18 0015 05/28/18 0059 05/28/18 0107  BP: 101/78 122/84 106/77   Pulse: (!) 126 (!) 123 (!) 130   Resp:  18 20   Temp:   (!) 97.5 F (36.4 C)   TempSrc:   Oral   SpO2: 97% 98% 98%   Weight:    79.3 kg  Height:    5\' 8"  (1.727 m)   Eyes: Anicteric no pallor. ENMT: No discharge from the ears eyes nose or mouth. Neck: No mass felt.  No neck rigidity.  No JVD appreciated. Respiratory: Bilateral air entry present no rhonchi no crepitations. Cardiovascular: S1-S2 heard. Abdomen: Soft nontender bowel sounds present. Musculoskeletal: No edema. Skin: No rash. Neurologic: Alert awake oriented to time place and person.  Moves all extremities. Psychiatric: Appears normal per normal affect.   Labs on Admission: I have personally reviewed following labs and imaging studies  CBC: Recent Labs  Lab 05/27/18 2017  WBC 10.6*  HGB 14.6  HCT 43.5  MCV 88.4  PLT 213   Basic Metabolic Panel: Recent Labs  Lab 05/27/18 2017  NA 136  K 4.5  CL 105  CO2 21*  GLUCOSE 113*  BUN 14  CREATININE 1.46*  CALCIUM 8.8*   GFR: Estimated Creatinine Clearance: 72.2 mL/min (A) (by C-G formula based on SCr of 1.46 mg/dL (H)). Liver Function Tests: Recent Labs  Lab 05/27/18 2017  AST 29  ALT 53*  ALKPHOS 52  BILITOT 0.9  PROT 6.0*  ALBUMIN 3.4*   Recent Labs  Lab 05/27/18 2017  LIPASE 34   No results for input(s): AMMONIA in the last 168 hours. Coagulation Profile: No results for input(s): INR, PROTIME in the last 168 hours. Cardiac Enzymes: No results for input(s): CKTOTAL, CKMB, CKMBINDEX, TROPONINI in the last 168 hours. BNP (last 3 results) No results for input(s): PROBNP in the last 8760 hours. HbA1C: No results for input(s): HGBA1C in the last 72  hours. CBG: No results for input(s): GLUCAP in the last 168 hours. Lipid Profile: No results for input(s): CHOL, HDL, LDLCALC, TRIG, CHOLHDL, LDLDIRECT in the last 72 hours. Thyroid Function Tests: No results for input(s): TSH, T4TOTAL, FREET4, T3FREE, THYROIDAB in the last 72 hours. Anemia Panel: No results for input(s): VITAMINB12, FOLATE, FERRITIN, TIBC, IRON, RETICCTPCT in the last 72 hours. Urine analysis:    Component Value Date/Time   COLORURINE AMBER (A) 05/27/2018 2012   APPEARANCEUR CLEAR 05/27/2018 2012   LABSPEC 1.029 05/27/2018 2012   PHURINE 5.0 05/27/2018 2012   GLUCOSEU NEGATIVE 05/27/2018 2012   HGBUR NEGATIVE 05/27/2018 2012   BILIRUBINUR NEGATIVE 05/27/2018 2012   KETONESUR NEGATIVE 05/27/2018 2012   PROTEINUR 30 (A) 05/27/2018 2012   NITRITE NEGATIVE 05/27/2018 2012   LEUKOCYTESUR NEGATIVE 05/27/2018 2012   Sepsis Labs: @LABRCNTIP (procalcitonin:4,lacticidven:4) )No results found for this or any previous visit (from the past 240 hour(s)).   Radiological Exams on Admission: Dg Chest 2 View  Result Date: 05/27/2018 CLINICAL DATA:  Stomach pain EXAM: CHEST - 2 VIEW COMPARISON:  CT 05/27/2018 FINDINGS: Cardiomegaly with vascular congestion and bilateral interstitial and ground-glass opacity consistent with edema. No significant pleural effusion. No pneumothorax. IMPRESSION: Cardiomegaly with vascular congestion and diffuse bilateral interstitial and ground-glass opacity consistent with pulmonary edema Electronically Signed   By: Jasmine Pang M.D.   On: 05/27/2018 23:14   Ct Abdomen Pelvis W Contrast  Result Date: 05/27/2018 CLINICAL DATA:  Abdominal pain. EXAM: CT ABDOMEN AND PELVIS WITH CONTRAST TECHNIQUE: Multidetector CT imaging of the abdomen and pelvis was performed using the standard protocol following bolus administration of intravenous contrast. CONTRAST:  OMNIPAQUE IOHEXOL 300 MG/ML  SOLN COMPARISON:  None. FINDINGS: Lower chest: Geographic ground-glass  opacities in the bases. Mild smooth septal thickening. Heart is prominent size for age. Hepatobiliary: No focal hepatic lesion, however examination is performed on arterial phase imaging which limits detailed parenchymal assessment. Gallbladder partially distended, no calcified stone. No biliary dilatation. Pancreas: No ductal dilatation or inflammation. Spleen: Normal in size without focal abnormality. Adrenals/Urinary Tract: Normal adrenal glands. No hydronephrosis or perinephric edema. Homogeneous renal enhancement. Urinary bladder is nondistended and well evaluated. Stomach/Bowel: Bowel evaluation is limited in the absence of enteric contrast and paucity of intra-abdominal fat. Stomach is nondistended. Small amount of high-density ingested material in the stomach and duodenum. No small bowel dilatation, inflammation, or obstruction. Normal appendix, for example images 58-61 series 3. Moderate stool in the proximal colon the remainder the colon is decompressed which limits assessment, no obvious colonic wall thickening. No pericolonic inflammation. No visualize colonic diverticula. Vascular/Lymphatic: Imaging obtained in arterial phase. Abdominal  aorta is normal in caliber. No adenopathy. Reproductive: Prostate is unremarkable. Other: No free air or free fluid. Musculoskeletal: There are no acute or suspicious osseous abnormalities. IMPRESSION: 1. No acute abnormality in the abdomen/pelvis. 2. Geographic ground-glass opacities in the lung bases with smooth septal thickening. Findings suggest pulmonary edema. Recommend correlation with chest radiograph. Electronically Signed   By: Narda Rutherford M.D.   On: 05/27/2018 22:22    EKG: Independently reviewed.  Sinus tachycardia.  Assessment/Plan Principal Problem:   Acute CHF (congestive heart failure) (HCC) Active Problems:   Left lower quadrant abdominal pain   Acute pulmonary edema (HCC)    1. Acute pulmonary edema/acute CHF unknown EF has history of  congenital heart disease unclear cause and history with no follow-up for many years -Lasix 60 mg IV was given in the ER.  Based on the response will have her Lasix.  Check 2D echo cycle cardiac markers consult cardiology.  Closely follow intake output daily weights and metabolic panel. 2. Abdominal pain cause not clear abdomen appears benign.  Labs largely unremarkable.  Continue to monitor.  Check lactate. 3. Alcohol abuse -drinks every day of beer.  On CIWA protocol.   DVT prophylaxis: SCDs until we get 2D echo to make sure there is no significant pericardial effusion. Code Status: Full code. Family Communication: Family at the bedside. Disposition Plan: Home. Consults called: Cardiology. Admission status: Inpatient.   Eduard Clos MD [facility] Pager [phone].  If 7PM-7AM, please contact night-coverage [email or web address] Password [email or web address]  05/28/2018, 1:38 AM

## 2018-05-28 NOTE — Progress Notes (Signed)
  Echocardiogram 2D Echocardiogram has been performed.  Taiesha Bovard L Androw 05/28/2018, 12:00 PM

## 2018-05-28 NOTE — Consult Note (Addendum)
Cardiology Consultation:   Patient ID: Rebel Wurtz MRN: [id number]; DOB: 07/03/1990  Admit date: 05/27/2018 Date of Consult: 05/28/2018  Primary Care Provider: Patient, No Pcp Per Primary Cardiologist: New Dr. Antoine Poche    Patient Profile:   Konstantin Heiges is a 28 y.o. male with a hx of congential heart disease, and polysubstance abuse who is being seen today for the evaluation of CHF  at the request of Dr. Janee Morn.   Hx of syncope at age 37 while playing sport. Dx with Congential heart disease at [place]. Says "tissue removed from R heart". No cardiac follow up since then.   Moved to [place] last year.  History of Present Illness:   Mr. Elick presented to [facility] ER yesterday with 2 months history of progressive worsening abdominal pain with orthopnea and PND.  He describes his pain at epigastric area as someone punching him.  Chest x-ray with cardiomegaly and pulmonary edema.  Admitted for IV diuresis.  Net I&O -1.9 L so far.  Patient drinks 3-4 beers every day however over the weekend he drinks 1 case of beer.  Sometimes drinks hard liquor as well.  He smokes tobacco and marijuana every day. Used to smoke "[employer]" but quite about year ago.   Patient moves furniture for living.  He denies any exertional chest pain or shortness of breath.  He has intermittent palpitation mostly after drinking alcohol.  No family history of sudden cardiac death, CHF or CAD.  Past Medical History:  Diagnosis Date  . Alcohol abuse   . Chronic systolic (congestive) heart failure (HCC)   . Congenital heart disease   . Marijuana abuse   . Syncope    at age 47  . Tobacco abuse     Inpatient Medications: Scheduled Meds: . folic acid  1 mg Oral Daily  . furosemide  40 mg Intravenous Q12H  . multivitamin with minerals  1 tablet Oral Daily  . pantoprazole  40 mg Oral Q0600  . thiamine  100 mg Oral Daily   Or  . thiamine  100 mg Intravenous Daily   Continuous  Infusions:  PRN Meds: acetaminophen **OR** acetaminophen, LORazepam **OR** LORazepam, ondansetron **OR** ondansetron (ZOFRAN) IV  Allergies:   No Known Allergies  Social History:   Social History   Socioeconomic History  . Marital status: Single    Spouse name: Not on file  . Number of children: Not on file  . Years of education: Not on file  . Highest education level: Not on file  Occupational History  . Not on file  Social Needs  . Financial resource strain: Not on file  . Food insecurity:    Worry: Not on file    Inability: Not on file  . Transportation needs:    Medical: Not on file    Non-medical: Not on file  Tobacco Use  . Smoking status: Current Every Day Smoker  . Smokeless tobacco: Never Used  Substance and Sexual Activity  . Alcohol use: Yes    Comment: Daily.  . Drug use: Not on file  . Sexual activity: Not on file  Lifestyle  . Physical activity:    Days per week: Not on file    Minutes per session: Not on file  . Stress: Not on file  Relationships  . Social connections:    Talks on phone: Not on file    Gets together: Not on file    Attends religious service: Not on file    Active member of  club or organization: Not on file    Attends meetings of clubs or organizations: Not on file    Relationship status: Not on file  . Intimate partner violence:    Fear of current or ex partner: Not on file    Emotionally abused: Not on file    Physically abused: Not on file    Forced sexual activity: Not on file  Other Topics Concern  . Not on file  Social History Narrative  . Not on file    Family History:   Family History  Problem Relation Age of Onset  . CAD Neg Hx   . Diabetes Mellitus II Neg Hx      ROS:  Please see the history of present illness.  All other ROS reviewed and negative.     Physical Exam/Data:   Vitals:   05/28/18 0107 05/28/18 0306 05/28/18 0422 05/28/18 0734  BP:  120/78 98/65 115/72  Pulse:  (!) 129 (!) 108 (!) 111  Resp:    18 20  Temp:   97.7 F (36.5 C) 97.8 F (36.6 C)  TempSrc:   Oral Oral  SpO2:   (!) 87% 98%  Weight: 79.3 kg     Height: 5\' 8"  (1.727 m)       Intake/Output Summary (Last 24 hours) at 05/28/2018 1421 Last data filed at 05/28/2018 0900 Gross per 24 hour  Intake 240 ml  Output 2330 ml  Net -2090 ml   Last 3 Weights 05/28/2018 05/27/2018  Weight (lbs) 174 lb 12.8 oz 160 lb  Weight (kg) 79.289 kg 72.576 kg     Body mass index is 26.58 kg/m.  General:  Well nourished, well developed, in no acute distress HEENT: normal Lymph: no adenopathy Neck: no JVD Endocrine:  No thryomegaly Vascular: No carotid bruits; FA pulses 2+ bilaterally without bruits  Cardiac:  normal S1, S2; regular tachycardic; no murmur Lungs:  clear to auscultation bilaterally, no wheezing, rhonchi or rales  Abd: Distended with diffuse tenderness especially at epigastric area Ext: no edema Musculoskeletal:  No deformities, BUE and BLE strength normal and equal Skin: warm and dry  Neuro:  CNs 2-12 intact, no focal abnormalities noted Psych:  Normal affect   EKG:  The EKG was personally reviewed and demonstrates: Sinus tachycardia with LVH and early repolarization Telemetry:  Telemetry was personally reviewed and demonstrates: Sinus tachycardia at rate of 120 bpm, 23 beats of nonsustained VT  Relevant CV Studies:  Echo 05/28/18 1. The left ventricle has severely reduced systolic function, with an ejection fraction of 20-25%. The cavity size was moderately dilated. Left ventricular diastolic Doppler parameters are consistent with pseudonormalization.  2. The right ventricle has normal systolic function. The cavity was normal. There is no increase in right ventricular wall thickness.  3. Left atrial size was mildly dilated.  4. The mitral valve is degenerative. Mild thickening of the mitral valve leaflet. Mitral valve regurgitation is moderate by color flow Doppler.  5. The tricuspid valve is normal in structure.  Tricuspid valve regurgitation is moderate.  6. The aortic valve is tricuspid Mild thickening of the aortic valve Aortic valve regurgitation is trivial by color flow Doppler.  7. The pulmonic valve was normal in structure.  Laboratory Data:  Chemistry Recent Labs  Lab 05/27/18 2017 05/28/18 0102  NA 136 135  K 4.5 4.4  CL 105 107  CO2 21* 18*  GLUCOSE 113* 126*  BUN 14 15  CREATININE 1.46* 1.45*  CALCIUM 8.8* 8.9  GFRNONAA >  60 >60  GFRAA >60 >60  ANIONGAP 10 10    Recent Labs  Lab 05/27/18 2017  PROT 6.0*  ALBUMIN 3.4*  AST 29  ALT 53*  ALKPHOS 52  BILITOT 0.9   Hematology Recent Labs  Lab 05/27/18 2017 05/28/18 0102  WBC 10.6* 9.1  RBC 4.92 5.20  HGB 14.6 14.9  HCT 43.5 46.1  MCV 88.4 88.7  MCH 29.7 28.7  MCHC 33.6 32.3  RDW 12.9 12.9  PLT 213 205   Cardiac Enzymes Recent Labs  Lab 05/28/18 0102 05/28/18 0712 05/28/18 1309  TROPONINI <0.03 <0.03 <0.03   No results for input(s): TROPIPOC in the last 168 hours.  BNP Recent Labs  Lab 05/28/18 0102  BNP 1,232.5*    Radiology/Studies:  Dg Chest 2 View  Result Date: 05/27/2018 CLINICAL DATA:  Stomach pain EXAM: CHEST - 2 VIEW COMPARISON:  CT 05/27/2018 FINDINGS: Cardiomegaly with vascular congestion and bilateral interstitial and ground-glass opacity consistent with edema. No significant pleural effusion. No pneumothorax. IMPRESSION: Cardiomegaly with vascular congestion and diffuse bilateral interstitial and ground-glass opacity consistent with pulmonary edema Electronically Signed   By: Jasmine Pang M.D.   On: 05/27/2018 23:14   Ct Abdomen Pelvis W Contrast  Result Date: 05/27/2018 CLINICAL DATA:  Abdominal pain. EXAM: CT ABDOMEN AND PELVIS WITH CONTRAST TECHNIQUE: Multidetector CT imaging of the abdomen and pelvis was performed using the standard protocol following bolus administration of intravenous contrast. CONTRAST:  OMNIPAQUE IOHEXOL 300 MG/ML  SOLN COMPARISON:  None. FINDINGS: Lower chest:  Geographic ground-glass opacities in the bases. Mild smooth septal thickening. Heart is prominent size for age. Hepatobiliary: No focal hepatic lesion, however examination is performed on arterial phase imaging which limits detailed parenchymal assessment. Gallbladder partially distended, no calcified stone. No biliary dilatation. Pancreas: No ductal dilatation or inflammation. Spleen: Normal in size without focal abnormality. Adrenals/Urinary Tract: Normal adrenal glands. No hydronephrosis or perinephric edema. Homogeneous renal enhancement. Urinary bladder is nondistended and well evaluated. Stomach/Bowel: Bowel evaluation is limited in the absence of enteric contrast and paucity of intra-abdominal fat. Stomach is nondistended. Small amount of high-density ingested material in the stomach and duodenum. No small bowel dilatation, inflammation, or obstruction. Normal appendix, for example images 58-61 series 3. Moderate stool in the proximal colon the remainder the colon is decompressed which limits assessment, no obvious colonic wall thickening. No pericolonic inflammation. No visualize colonic diverticula. Vascular/Lymphatic: Imaging obtained in arterial phase. Abdominal aorta is normal in caliber. No adenopathy. Reproductive: Prostate is unremarkable. Other: No free air or free fluid. Musculoskeletal: There are no acute or suspicious osseous abnormalities. IMPRESSION: 1. No acute abnormality in the abdomen/pelvis. 2. Geographic ground-glass opacities in the lung bases with smooth septal thickening. Findings suggest pulmonary edema. Recommend correlation with chest radiograph. Electronically Signed   By: Narda Rutherford M.D.   On: 05/27/2018 22:22    Assessment and Plan:   1. Acute systolic heart failure Presented with 2 to 3 months history of progressive worsening abdominal tightness/pain, orthopnea and PND.  Echocardiogram with severely LV reduced function to 20%, moderately dilated left atrium.  No  significant valvular heart disease. -Chest x-ray with pulmonary edema.  BNP elevated. 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 -EKG with sinus rhythm and repolarization abnormality with LVH. -He denies any exertional chest pain or dyspnea. -Continue IV diuresis.  Strict I&O.  Start Entresto.  Will add beta-blocker as fluid status improves. -He will need right and left cath this admission with advanced heart failure evaluation.  2.  Congenital heart disease -Diagnosed at age 62 after syncope episode.  Will request records.  3.  Alcohol abuse -Advised complete cessation. On CIWA protocol.  4.  Tobacco and marijuana smoking -Advised cessation.  5. AKI -Unknown baseline creatinine.  Creatinine stable at 1.45.  Continue IV Lasix.  Add Entresto as above.  Monitor renal function closely.  For questions or updates, please contact [facility] Please consult [email or web address] for contact info under    Lorelei Pont, PA  05/28/2018 2:21 PM   History and all data above reviewed.  Patient examined.  I agree with the findings as above.   The patient describes a history of having something removed when he was 28 years old.  This was percutaneous.  I do not have the details of this.  He lives in [place].  He was supposed to follow-up every couple of years but he did not that range 17.  He now presents with abdominal fullness and bloating and discomfort particularly after eating.  He is not describing shortness of breath except when he gets up to go to the bathroom at night and he does sound like he has some PND.  He is not had any weight gain or edema.  He comes in and is tachycardic.  He has cardiomegaly on chest x-ray.  BNP is elevated.  Enzymes are negative.  Creatinine is slightly elevated.  He is found on echo to have a severely reduced ejection fraction of about 20% with global hypokinesis.  The patient exam reveals COR:RRR, positive S3 and S4  , PMI  displaced and sustained lungs: Decreased breath sounds at the bases  ,  Abd: Mildly distended.  , Ext No edema  .  All available labs, radiology testing, previous records reviewed. Agree with documented assessment and plan.  Acute systolic HF: The patient has decompensated heart failure.  He may start low-dose dig, spironolactone and ARB.  He is getting diuresis.  I have discussed his case with Dr.Bensimhon and the patient is on for right and left heart cath on Monday.  I discussed the pathophysiology at length with the patient.  Etiology is not clear at this point.  Can get the records from [place].  This may be related to alcohol as he does drink quite a bit and we talked about this association.   Fayrene Fearing Danika Kluender  3:01 PM  05/28/2018

## 2018-05-28 NOTE — Progress Notes (Signed)
I have seen and assessed patient and agree with Dr. Katherene Ponto assessment and plan.  28-year-old gentleman history of congenital heart disease not clearly stated has not followed up for several years presenting to the ED with complaints of left upper and lower quadrant abdominal pain x4 days, shortness of breath on exertion, orthopnea.  CT abdomen and pelvis which was done concerning for acute pulmonary edema confirmed with chest x-ray with cardiomegaly.  Patient also noted to be tachycardic.  As of IV Lasix with good urine output and admitted for acute CHF exacerbation with unknown EF.  2D echo ordered and patient with a EF of 20 to 25%, mildly dilated left atrial size, degenerative mitral valve with moderate mitral valvular regurgitation, moderate tricuspid valvular regurgitation.  Patient also noted per RN to have a 23 beat run of V. tach around 3 AM which converted back to sinus tachycardia with heart rates in the 140s this morning.  Patient given IV Lopressor.  Place on Lasix 40 mg IV every 12 hours.  Strict I's and O's.  May need ACE inhibitor.  Cardiology consulted for further evaluation and management.  No charge.

## 2018-05-28 NOTE — Care Management Note (Signed)
Case Management Note  Patient Details  Name: Bruce Morse MRN: [id number] Date of Birth: 01-Sep-1990  Subjective/Objective:   CHF                Action/Plan: Patient lives at home with his girlfriend, works part time as a Scientist, forensic; has not seen a PCP in years;  He moved from [place] to [place] about one year ago he is agreeable to be seen at the [facility] and [facility] for ongoing care; Patent examiner has screened the patient - she stated that she will start the application for Medicaid and Disability ( it usually takes 90 days for approval but she plans to have the paperwork expedited),. CM talked to patient about lifestyles changes - he drinks beer, smokes and use MJ; patient stated that he will work on it. CM will continue to follow for progression of care  Expected Discharge Date:     Possibly 06/02/2018             Expected Discharge Plan:  Home/Self Care  In-House Referral:  Financial Counselor  Discharge planning Services  CM Consult  Status of Service:  In process, will continue to follow  Reola Mosher [phone] 05/28/2018, 10:07 AM

## 2018-05-28 NOTE — Progress Notes (Signed)
Around 0300, CCMD notified me, pt had 23 beats of Vtach, converted back to ST HR in 140s sustained. Attending paged for further orders. Lopressors 5mg  IV push given per order. Pt's HR dropped to 110-120s. Pt complained of LUQ abd pain, denies N&V. Tylenol and zofran given. 30 mins later, pt is resting in the bed. States that he feels relief from the medication. Will continue to monitor.  Judithann Sheen, RN

## 2018-05-28 NOTE — Progress Notes (Signed)
Pt arrived to [facility] room 3 from ED. Pt ambulated to the bed. Tele box 27 applied, CCMD notified. Family at bedside. Pt denies chest/abdominal pain at this time. Will continue to monitor.  Judithann Sheen, RN

## 2018-05-29 ENCOUNTER — Other Ambulatory Visit: Payer: Self-pay

## 2018-05-29 DIAGNOSIS — I472 Ventricular tachycardia: Secondary | ICD-10-CM

## 2018-05-29 DIAGNOSIS — F1090 Alcohol use, unspecified, uncomplicated: Secondary | ICD-10-CM

## 2018-05-29 DIAGNOSIS — Z7289 Other problems related to lifestyle: Secondary | ICD-10-CM

## 2018-05-29 DIAGNOSIS — I426 Alcoholic cardiomyopathy: Secondary | ICD-10-CM | POA: Clinically undetermined

## 2018-05-29 DIAGNOSIS — Q249 Congenital malformation of heart, unspecified: Secondary | ICD-10-CM

## 2018-05-29 DIAGNOSIS — Z789 Other specified health status: Secondary | ICD-10-CM

## 2018-05-29 DIAGNOSIS — R0902 Hypoxemia: Secondary | ICD-10-CM | POA: Diagnosis present

## 2018-05-29 DIAGNOSIS — I4729 Other ventricular tachycardia: Secondary | ICD-10-CM

## 2018-05-29 DIAGNOSIS — N179 Acute kidney failure, unspecified: Secondary | ICD-10-CM | POA: Clinically undetermined

## 2018-05-29 LAB — COMPREHENSIVE METABOLIC PANEL
ALK PHOS: 50 U/L (ref 38–126)
ALT: 48 U/L — ABNORMAL HIGH (ref 0–44)
ANION GAP: 12 (ref 5–15)
AST: 30 U/L (ref 15–41)
Albumin: 3.3 g/dL — ABNORMAL LOW (ref 3.5–5.0)
BUN: 15 mg/dL (ref 6–20)
CO2: 25 mmol/L (ref 22–32)
Calcium: 9 mg/dL (ref 8.9–10.3)
Chloride: 100 mmol/L (ref 98–111)
Creatinine, Ser: 1.65 mg/dL — ABNORMAL HIGH (ref 0.61–1.24)
GFR calc Af Amer: >60 mL/min (ref >60)
GFR calc non Af Amer: 55 mL/min — ABNORMAL LOW (ref >60)
Glucose, Bld: 93 mg/dL (ref 70–99)
Potassium: 3.6 mmol/L (ref 3.5–5.1)
Sodium: 137 mmol/L (ref 135–145)
Total Bilirubin: 1.2 mg/dL (ref 0.3–1.2)
Total Protein: 6.2 g/dL — ABNORMAL LOW (ref 6.5–8.1)

## 2018-05-29 LAB — CBC
HCT: 45.7 % (ref 39.0–52.0)
HEMOGLOBIN: 15.2 g/dL (ref 13.0–17.0)
MCH: 28.8 pg (ref 26.0–34.0)
MCHC: 33.3 g/dL (ref 30.0–36.0)
MCV: 86.7 fL (ref 80.0–100.0)
Platelets: 226 10*3/uL (ref 150–400)
RBC: 5.27 MIL/uL (ref 4.22–5.81)
RDW: 12.4 % (ref 11.5–15.5)
WBC: 7.6 10*3/uL (ref 4.0–10.5)
nRBC: 0 % (ref 0.0–0.2)

## 2018-05-29 LAB — MAGNESIUM: Magnesium: 2.1 mg/dL (ref 1.7–2.4)

## 2018-05-29 MED ORDER — SACUBITRIL-VALSARTAN 24-26 MG PO TABS
1.0000 | ORAL_TABLET | Freq: Two times a day (BID) | ORAL | Status: DC
  Administered 2018-05-30 – 2018-06-02 (×7): 1 via ORAL
  Filled 2018-05-29 (×7): qty 1

## 2018-05-29 MED ORDER — ENOXAPARIN SODIUM 30 MG/0.3ML ~~LOC~~ SOLN
30.0000 mg | SUBCUTANEOUS | Status: DC
  Administered 2018-05-29: 30 mg via SUBCUTANEOUS
  Filled 2018-05-29: qty 0.3

## 2018-05-29 MED ORDER — POTASSIUM CHLORIDE CRYS ER 20 MEQ PO TBCR
40.0000 meq | EXTENDED_RELEASE_TABLET | Freq: Once | ORAL | Status: DC
  Filled 2018-05-29: qty 2

## 2018-05-29 MED ORDER — POTASSIUM CHLORIDE CRYS ER 20 MEQ PO TBCR
40.0000 meq | EXTENDED_RELEASE_TABLET | Freq: Once | ORAL | Status: AC
  Administered 2018-05-29: 40 meq via ORAL
  Filled 2018-05-29: qty 2

## 2018-05-29 NOTE — Progress Notes (Signed)
Patient insists on leaving bed in high position off the ground. Patient  Is alert and oriented and has been educated on fall prevention measure. States he like his bed high and he is "ok". Staff will continue hourly rounding.

## 2018-05-29 NOTE — Progress Notes (Signed)
PROGRESS NOTE    Bruce Morse  WNU:[id number] DOB: [birth year] DOA: 05/27/2018 PCP: Patient, No Pcp Per    Brief Narrative:  HPI per Dr. Lorette Ang is a 28 y.o. 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  The same time patient has been also having shortness of breath on exertion and orthopnea for the same duration of time.  Denies any chest pain productive cough fever chills.  Denies any nausea vomiting or diarrhea.  Abdominal pain is not related to food or defecation.  No weight loss or any blood in the stools.  Patient has not noticed any swelling in the legs.  Patient states that he frequently wakes up in the sleep from shortness of breath last few days.  ED Course: In the ER CT scan of the abdomen and pelvis done shows features concerning for acute pulmonary edema this was confirmed with chest x-ray shows cardiomegaly..  Lipase LFTs were largely normal.  Creatinine 1.4.  Slightly elevated WBC.  UA shows mild proteinuria.  Patient was found to be tachycardic.  An EKG shows sinus tachycardia.  Lasix 60 mg IV was given.  Patient admitted for acute CHF unknown EF.   Assessment & Plan:   Principal Problem:   Acute systolic CHF (congestive heart failure) (HCC) Active Problems:   Left lower quadrant abdominal pain   Acute pulmonary edema (HCC)   Hypoxemia   Alcoholic cardiomyopathy (HCC)   AKI (acute kidney injury) (HCC)   Congenital heart disease   NSVT (nonsustained ventricular tachycardia) (HCC)   Alcohol use  1 acute systolic heart failure/cardiomyopathy Patient with history of congenital heart disease not clearly stated which was diagnosed when he was in [place] the ED with shortness of breath on exertion and orthopnea of unknown duration.  CT abdomen and pelvis which was done showing  features concerning for pulmonary edema which was confirmed by chest x-ray which showed cardiomegaly.  Patient placed on IV Lasix with good diuresis with a urine output of 2.2 L over the past 24 hours.  Patient is -3.785 L during this hospitalization.  Current weight is 75.5 kg from 79.3 kg yesterday 05/28/2018.  2D echo done with a severely reduced systolic function of 20 to 25%, moderately dilated left ventricle, moderate mitral valvular regurgitation, moderate tricuspid valvular regurgitation.  Patient with clinical improvement.  Patient with history of chronic alcohol use and concern for alcoholic cardiomyopathy.  Patient seen in consultation by cardiology and patient is IV diuretics have been discontinued.  Patient placed on spironolactone digoxin and to be started on low-dose Entresto tomorrow per cardiology.  Cardiology recommending patient to be started on oral Lasix tomorrow.  Patient being planned for right and left heart cardiac catheterization on Monday, 05/31/2018.  Cardiology following.  2.  Congenital heart disease Records pending and have been requested by cardiology.  Patient noted to have had congenital heart disease diagnosed at age 77 after a syncopal episode while playing basketball.  Patient for right and left heart catheterization on Monday, 05/31/2018.  Per cardiology.  3.  Nonsustained V. tach on telemetry Patient currently asymptomatic.  Per cardiology patient will need a LifeVest prior to discharge.  Keep magnesium greater than 2.  Keep potassium greater than 4.  May need to be started on low-dose beta-blocker however will defer to cardiology.  Follow.  4.  Alcohol abuse Complete alcohol cessation stressed to patient.  Patient with no signs of withdrawal.,  Folic acid, PPI.  Continue the Ativan withdrawal protocol.  5.  Tobacco or marijuana use Complete cessation stressed to patient.  6.  Abdominal pain No clear etiology.  May be secondary to volume overload.  CT abdomen and  pelvis unremarkable.  Clinical improvement.  Currently asymptomatic.  Patient started on a PPI.  Follow.  7.  Acute kidney injury Slight bump in creatinine likely may be secondary to diuresis.  IV Lasix has been discontinued.  Urinalysis with 30 of protein.  Monitor renal function.  Follow.   DVT prophylaxis: Lovenox Code Status: Full Family Communication: Updated patient.  No family at bedside. Disposition Plan: Likely home when clinically improved and when okay with cardiology.   Consultants:   Cardiology: Dr. Antoine Poche 05/28/2018  Procedures:  2D echo 05/28/2018  Chest x-ray 05/27/2018  CT abdomen and pelvis 05/27/2018  Antimicrobials:  None   Subjective: Standing by the sink about to eat his lunch.  Denies any chest pain.  States shortness of breath has improved since admission.  Denies any lightheadedness or dizziness.  Objective: Vitals:   05/29/18 0639 05/29/18 1129 05/29/18 1240 05/29/18 1606  BP: 108/83  99/70 113/74  Pulse: (!) 106 (!) 118 (!) 166 (!) 119  Resp: 18  18   Temp: 98.3 F (36.8 C)  97.8 F (36.6 C)   TempSrc: Oral  Oral   SpO2: 100%  100%   Weight: 75.5 kg     Height:        Intake/Output Summary (Last 24 hours) at 05/29/2018 1615 Last data filed at 05/29/2018 1257 Gross per 24 hour  Intake 480 ml  Output 1475 ml  Net -995 ml   Filed Weights   05/27/18 2010 05/28/18 0107 05/29/18 0639  Weight: 72.6 kg 79.3 kg 75.5 kg    Examination:  General exam: Appears calm and comfortable  Respiratory system: Clear to auscultation. Respiratory effort normal. Cardiovascular system: S1 & S2 heard, RRR. No JVD, murmurs, rubs, gallops or clicks. No pedal edema. Gastrointestinal system: Abdomen is nondistended, soft and nontender. No organomegaly or masses felt. Normal bowel sounds heard. Central nervous system: Alert and oriented. No focal neurological deficits. Extremities: Symmetric 5 x 5 power. Skin: No rashes, lesions or ulcers Psychiatry: Judgement  and insight appear normal. Mood & affect appropriate.     Data Reviewed: I have personally reviewed following labs and imaging studies  CBC: Recent Labs  Lab 05/27/18 2017 05/28/18 0102 05/29/18 0417  WBC 10.6* 9.1 7.6  HGB 14.6 14.9 15.2  HCT 43.5 46.1 45.7  MCV 88.4 88.7 86.7  PLT 213 205 226   Basic Metabolic Panel: Recent Labs  Lab 05/27/18 2017 05/28/18 0102 05/29/18 0417  NA 136 135 137  K 4.5 4.4 3.6  CL 105 107 100  CO2 21* 18* 25  GLUCOSE 113* 126* 93  BUN 14 15 15   CREATININE 1.46* 1.45* 1.65*  CALCIUM 8.8* 8.9 9.0  MG  --  2.0 2.1   GFR: Estimated Creatinine Clearance: 63.9 mL/min (A) (by C-G formula based on SCr of 1.65 mg/dL (H)). Liver Function Tests: Recent Labs  Lab 05/27/18 2017 05/29/18 0417  AST 29 30  ALT 53* 48*  ALKPHOS 52 50  BILITOT 0.9 1.2  PROT 6.0* 6.2*  ALBUMIN 3.4* 3.3*   Recent Labs  Lab 05/27/18 2017  LIPASE 34   No results for input(s): AMMONIA in the last  168 hours. Coagulation Profile: No results for input(s): INR, PROTIME in the last 168 hours. Cardiac Enzymes: Recent Labs  Lab 05/28/18 0102 05/28/18 0712 05/28/18 1309  TROPONINI <0.03 <0.03 <0.03   BNP (last 3 results) No results for input(s): PROBNP in the last 8760 hours. HbA1C: No results for input(s): HGBA1C in the last 72 hours. CBG: No results for input(s): GLUCAP in the last 168 hours. Lipid Profile: No results for input(s): CHOL, HDL, LDLCALC, TRIG, CHOLHDL, LDLDIRECT in the last 72 hours. Thyroid Function Tests: Recent Labs    05/28/18 0102  TSH 3.393   Anemia Panel: No results for input(s): VITAMINB12, FOLATE, FERRITIN, TIBC, IRON, RETICCTPCT in the last 72 hours. Sepsis Labs: No results for input(s): PROCALCITON, LATICACIDVEN in the last 168 hours.  No results found for this or any previous visit (from the past 240 hour(s)).       Radiology Studies: Dg Chest 2 View  Result Date: 05/27/2018 CLINICAL DATA:  Stomach pain EXAM: CHEST  - 2 VIEW COMPARISON:  CT 05/27/2018 FINDINGS: Cardiomegaly with vascular congestion and bilateral interstitial and ground-glass opacity consistent with edema. No significant pleural effusion. No pneumothorax. IMPRESSION: Cardiomegaly with vascular congestion and diffuse bilateral interstitial and ground-glass opacity consistent with pulmonary edema Electronically Signed   By: Jasmine Pang M.D.   On: 05/27/2018 23:14   Ct Abdomen Pelvis W Contrast  Result Date: 05/27/2018 CLINICAL DATA:  Abdominal pain. EXAM: CT ABDOMEN AND PELVIS WITH CONTRAST TECHNIQUE: Multidetector CT imaging of the abdomen and pelvis was performed using the standard protocol following bolus administration of intravenous contrast. CONTRAST:  OMNIPAQUE IOHEXOL 300 MG/ML  SOLN COMPARISON:  None. FINDINGS: Lower chest: Geographic ground-glass opacities in the bases. Mild smooth septal thickening. Heart is prominent size for age. Hepatobiliary: No focal hepatic lesion, however examination is performed on arterial phase imaging which limits detailed parenchymal assessment. Gallbladder partially distended, no calcified stone. No biliary dilatation. Pancreas: No ductal dilatation or inflammation. Spleen: Normal in size without focal abnormality. Adrenals/Urinary Tract: Normal adrenal glands. No hydronephrosis or perinephric edema. Homogeneous renal enhancement. Urinary bladder is nondistended and well evaluated. Stomach/Bowel: Bowel evaluation is limited in the absence of enteric contrast and paucity of intra-abdominal fat. Stomach is nondistended. Small amount of high-density ingested material in the stomach and duodenum. No small bowel dilatation, inflammation, or obstruction. Normal appendix, for example images 58-61 series 3. Moderate stool in the proximal colon the remainder the colon is decompressed which limits assessment, no obvious colonic wall thickening. No pericolonic inflammation. No visualize colonic diverticula.  Vascular/Lymphatic: Imaging obtained in arterial phase. Abdominal aorta is normal in caliber. No adenopathy. Reproductive: Prostate is unremarkable. Other: No free air or free fluid. Musculoskeletal: There are no acute or suspicious osseous abnormalities. IMPRESSION: 1. No acute abnormality in the abdomen/pelvis. 2. Geographic ground-glass opacities in the lung bases with smooth septal thickening. Findings suggest pulmonary edema. Recommend correlation with chest radiograph. Electronically Signed   By: Narda Rutherford M.D.   On: 05/27/2018 22:22        Scheduled Meds: . digoxin  0.25 mg Oral Daily  . enoxaparin (LOVENOX) injection  30 mg Subcutaneous Q24H  . folic acid  1 mg Oral Daily  . multivitamin with minerals  1 tablet Oral Daily  . pantoprazole  40 mg Oral Q0600  . [START ON 05/30/2018] sacubitril-valsartan  1 tablet Oral BID  . spironolactone  12.5 mg Oral Daily  . thiamine  100 mg Oral Daily   Or  . thiamine  100 mg Intravenous Daily   Continuous Infusions:   LOS: 1 day    Time spent: 35 minutes    Ramiro Harvest, MD [facility]  If 7PM-7AM, please contact night-coverage [email or web address] Password [facility] 05/29/2018, 4:15 PM

## 2018-05-29 NOTE — Progress Notes (Signed)
Dr. Delton See requested orders for [facility] on Monday. It appears these have already been entered by [employer]. He is already on the board for Dr. Gala Romney on Monday. Dayna Dunn PA-C

## 2018-05-29 NOTE — Progress Notes (Signed)
Progress Note  Patient Name: Bruce Morse Date of Encounter: 05/29/2018  Primary Cardiologist: new pt/ Dr Antoine Poche  Subjective   He feels better, improved epigastric pain, never felt CP or SOB.  Inpatient Medications    Scheduled Meds: . digoxin  0.25 mg Oral Daily  . folic acid  1 mg Oral Daily  . furosemide  40 mg Intravenous Q12H  . losartan  25 mg Oral Daily  . multivitamin with minerals  1 tablet Oral Daily  . pantoprazole  40 mg Oral Q0600  . potassium chloride  40 mEq Oral Once  . spironolactone  12.5 mg Oral Daily  . thiamine  100 mg Oral Daily   Or  . thiamine  100 mg Intravenous Daily   Continuous Infusions:  PRN Meds: acetaminophen **OR** acetaminophen, LORazepam **OR** LORazepam, ondansetron **OR** ondansetron (ZOFRAN) IV   Vital Signs    Vitals:   05/28/18 0734 05/28/18 1500 05/28/18 2036 05/29/18 0639  BP: 115/72 100/71 104/84 108/83  Pulse: (!) 111 (!) 108 (!) 112 (!) 106  Resp: 20 20 12 18   Temp: 97.8 F (36.6 C) 98.2 F (36.8 C) 97.6 F (36.4 C) 98.3 F (36.8 C)  TempSrc: Oral Oral Oral Oral  SpO2: 98% 98% 100% 100%  Weight:    75.5 kg  Height:        Intake/Output Summary (Last 24 hours) at 05/29/2018 0844 Last data filed at 05/29/2018 0433 Gross per 24 hour  Intake 360 ml  Output 1900 ml  Net -1540 ml   Last 3 Weights 05/29/2018 05/28/2018 05/27/2018  Weight (lbs) 166 lb 6.4 oz 174 lb 12.8 oz 160 lb  Weight (kg) 75.479 kg 79.289 kg 72.576 kg      Telemetry    SR, 1 run of nsVT 4 beats - Personally Reviewed  ECG    No new tracing - Personally Reviewed  Physical Exam   GEN: No acute distress.   Neck: + 6 cm JVD B/L Cardiac: RRR, no murmurs, rubs, S4 gallops.  Respiratory: Clear to auscultation bilaterally. GI: Soft, nontender, non-distended  MS: Warm extremities, strong pulses, No edema; No deformity. Neuro:  Nonfocal  Psych: Normal affect   Labs    Chemistry Recent Labs  Lab 05/27/18 2017 05/28/18 0102 05/29/18 0417    NA 136 135 137  K 4.5 4.4 3.6  CL 105 107 100  CO2 21* 18* 25  GLUCOSE 113* 126* 93  BUN 14 15 15   CREATININE 1.46* 1.45* 1.65*  CALCIUM 8.8* 8.9 9.0  PROT 6.0*  --  6.2*  ALBUMIN 3.4*  --  3.3*  AST 29  --  30  ALT 53*  --  48*  ALKPHOS 52  --  50  BILITOT 0.9  --  1.2  GFRNONAA >60 >60 55*  GFRAA >60 >60 >60  ANIONGAP 10 10 12      Hematology Recent Labs  Lab 05/27/18 2017 05/28/18 0102 05/29/18 0417  WBC 10.6* 9.1 7.6  RBC 4.92 5.20 5.27  HGB 14.6 14.9 15.2  HCT 43.5 46.1 45.7  MCV 88.4 88.7 86.7  MCH 29.7 28.7 28.8  MCHC 33.6 32.3 33.3  RDW 12.9 12.9 12.4  PLT 213 205 226    Cardiac Enzymes Recent Labs  Lab 05/28/18 0102 05/28/18 0712 05/28/18 1309  TROPONINI <0.03 <0.03 <0.03   No results for input(s): TROPIPOC in the last 168 hours.   BNP Recent Labs  Lab 05/28/18 0102  BNP 1,232.5*     DDimer No results  for input(s): DDIMER in the last 168 hours.   Radiology    Dg Chest 2 View  Result Date: 05/27/2018 CLINICAL DATA:  Stomach pain EXAM: CHEST - 2 VIEW COMPARISON:  CT 05/27/2018 FINDINGS: Cardiomegaly with vascular congestion and bilateral interstitial and ground-glass opacity consistent with edema. No significant pleural effusion. No pneumothorax. IMPRESSION: Cardiomegaly with vascular congestion and diffuse bilateral interstitial and ground-glass opacity consistent with pulmonary edema Electronically Signed   By: Jasmine Pang M.D.   On: 05/27/2018 23:14   Ct Abdomen Pelvis W Contrast  Result Date: 05/27/2018 CLINICAL DATA:  Abdominal pain. EXAM: CT ABDOMEN AND PELVIS WITH CONTRAST TECHNIQUE: Multidetector CT imaging of the abdomen and pelvis was performed using the standard protocol following bolus administration of intravenous contrast. CONTRAST:  OMNIPAQUE IOHEXOL 300 MG/ML  SOLN COMPARISON:  None. FINDINGS: Lower chest: Geographic ground-glass opacities in the bases. Mild smooth septal thickening. Heart is prominent size for age.  Hepatobiliary: No focal hepatic lesion, however examination is performed on arterial phase imaging which limits detailed parenchymal assessment. Gallbladder partially distended, no calcified stone. No biliary dilatation. Pancreas: No ductal dilatation or inflammation. Spleen: Normal in size without focal abnormality. Adrenals/Urinary Tract: Normal adrenal glands. No hydronephrosis or perinephric edema. Homogeneous renal enhancement. Urinary bladder is nondistended and well evaluated. Stomach/Bowel: Bowel evaluation is limited in the absence of enteric contrast and paucity of intra-abdominal fat. Stomach is nondistended. Small amount of high-density ingested material in the stomach and duodenum. No small bowel dilatation, inflammation, or obstruction. Normal appendix, for example images 58-61 series 3. Moderate stool in the proximal colon the remainder the colon is decompressed which limits assessment, no obvious colonic wall thickening. No pericolonic inflammation. No visualize colonic diverticula. Vascular/Lymphatic: Imaging obtained in arterial phase. Abdominal aorta is normal in caliber. No adenopathy. Reproductive: Prostate is unremarkable. Other: No free air or free fluid. Musculoskeletal: There are no acute or suspicious osseous abnormalities. IMPRESSION: 1. No acute abnormality in the abdomen/pelvis. 2. Geographic ground-glass opacities in the lung bases with smooth septal thickening. Findings suggest pulmonary edema. Recommend correlation with chest radiograph. Electronically Signed   By: Narda Rutherford M.D.   On: 05/27/2018 22:22    Cardiac Studies     Patient Profile     28 y.o. male with new dg of acute systolic CHF  Assessment & Plan    1. Acute systolic heart failure Presented with 2 to 3 months history of progressive worsening abdominal tightness/pain, orthopnea and PND.  Echocardiogram with severely LV reduced function to 20%, moderately dilated left atrium.  No significant valvular  heart disease. -Chest x-ray with pulmonary edema.  BNP elevated. 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 -EKG with sinus rhythm and repolarization abnormality with LVH. -Strict I&O.  Negative 1.5 L, Crea up 1.4 ->1.65, d/c iv lasix, continue spironolactone, reevaluate in the am and start lasix PO then - BP soft, siwtch to low dose Entresto 26/24 mg PO BID starting tomorrow - nsVT on telemetry, he will need a Life Vest prior to discharge - no BB yet -plan for right and left heart cath on Monday..  2.  Congenital heart disease -Diagnosed at age 27 after syncope episode.  Will request records.  3.  Alcohol abuse -Advised complete cessation. On CIWA protocol.  4.  Tobacco and marijuana smoking -Advised cessation.  5. AKI -Unknown baseline creatinine.  Creatinine stable at 1.45.  Continue IV Lasix.  Add Entresto as above.  Monitor renal function closely.      For  questions or updates, please contact [facility] Please consult [email or web address] for contact info under     Signed, Tobias Alexander, MD  05/29/2018, 8:44 AM

## 2018-05-30 DIAGNOSIS — I5021 Acute systolic (congestive) heart failure: Secondary | ICD-10-CM

## 2018-05-30 LAB — COMPREHENSIVE METABOLIC PANEL
ALK PHOS: 47 U/L (ref 38–126)
ALT: 45 U/L — AB (ref 0–44)
AST: 26 U/L (ref 15–41)
Albumin: 3.2 g/dL — ABNORMAL LOW (ref 3.5–5.0)
Anion gap: 9 (ref 5–15)
BUN: 17 mg/dL (ref 6–20)
CALCIUM: 8.9 mg/dL (ref 8.9–10.3)
CO2: 23 mmol/L (ref 22–32)
Chloride: 105 mmol/L (ref 98–111)
Creatinine, Ser: 1.46 mg/dL — ABNORMAL HIGH (ref 0.61–1.24)
GFR calc Af Amer: >60 mL/min (ref >60)
GFR calc non Af Amer: >60 mL/min (ref >60)
Glucose, Bld: 91 mg/dL (ref 70–99)
Potassium: 4 mmol/L (ref 3.5–5.1)
Sodium: 137 mmol/L (ref 135–145)
TOTAL PROTEIN: 6.1 g/dL — AB (ref 6.5–8.1)
Total Bilirubin: 1 mg/dL (ref 0.3–1.2)

## 2018-05-30 LAB — CBC
HEMATOCRIT: 44.2 % (ref 39.0–52.0)
Hemoglobin: 15.2 g/dL (ref 13.0–17.0)
MCH: 29.7 pg (ref 26.0–34.0)
MCHC: 34.4 g/dL (ref 30.0–36.0)
MCV: 86.5 fL (ref 80.0–100.0)
Platelets: 236 10*3/uL (ref 150–400)
RBC: 5.11 MIL/uL (ref 4.22–5.81)
RDW: 12.5 % (ref 11.5–15.5)
WBC: 7.1 10*3/uL (ref 4.0–10.5)
nRBC: 0 % (ref 0.0–0.2)

## 2018-05-30 LAB — MAGNESIUM: Magnesium: 2.1 mg/dL (ref 1.7–2.4)

## 2018-05-30 MED ORDER — SODIUM CHLORIDE 0.9% FLUSH: 3.0000 mL | INTRAVENOUS | Status: DC | PRN

## 2018-05-30 MED ORDER — ENOXAPARIN SODIUM 40 MG/0.4ML ~~LOC~~ SOLN
40.0000 mg | SUBCUTANEOUS | Status: DC
  Administered 2018-05-30 – 2018-06-01 (×3): 40 mg via SUBCUTANEOUS
  Filled 2018-05-30 (×3): qty 0.4

## 2018-05-30 MED ORDER — SODIUM CHLORIDE 0.9% FLUSH
3.0000 mL | Freq: Two times a day (BID) | INTRAVENOUS | Status: DC
  Administered 2018-05-30 – 2018-05-31 (×2): 3 mL via INTRAVENOUS

## 2018-05-30 MED ORDER — SODIUM CHLORIDE 0.9 % IV SOLN
INTRAVENOUS | Status: DC
  Administered 2018-05-31: 07:00:00 via INTRAVENOUS

## 2018-05-30 MED ORDER — SODIUM CHLORIDE 0.9 % IV SOLN: 250.0000 mL | INTRAVENOUS | Status: DC | PRN

## 2018-05-30 MED ORDER — ASPIRIN 81 MG PO CHEW
81.0000 mg | CHEWABLE_TABLET | ORAL | Status: AC
  Administered 2018-05-31: 81 mg via ORAL
  Filled 2018-05-30: qty 1

## 2018-05-30 NOTE — Progress Notes (Addendum)
Progress Note  Patient Name: Bruce Morse Date of Encounter: 05/30/2018  Primary Cardiologist: new pt/ Dr Antoine Poche  Subjective   He feels well, improved epigastric pain, never felt CP or SOB. No problem walking in the hallway.  Inpatient Medications    Scheduled Meds: . digoxin  0.25 mg Oral Daily  . enoxaparin (LOVENOX) injection  30 mg Subcutaneous Q24H  . folic acid  1 mg Oral Daily  . multivitamin with minerals  1 tablet Oral Daily  . pantoprazole  40 mg Oral Q0600  . sacubitril-valsartan  1 tablet Oral BID  . spironolactone  12.5 mg Oral Daily  . thiamine  100 mg Oral Daily   Or  . thiamine  100 mg Intravenous Daily   Continuous Infusions:  PRN Meds: acetaminophen **OR** acetaminophen, LORazepam **OR** LORazepam, ondansetron **OR** ondansetron (ZOFRAN) IV   Vital Signs    Vitals:   05/29/18 1240 05/29/18 1606 05/29/18 1956 05/30/18 0529  BP: 99/70 113/74 110/84 96/72  Pulse: (!) 166 (!) 119 (!) 123 (!) 113  Resp: 18  16 16   Temp: 97.8 F (36.6 C)  98.1 F (36.7 C) 98.3 F (36.8 C)  TempSrc: Oral  Oral Oral  SpO2: 100%  100% 100%  Weight:    74.5 kg  Height:        Intake/Output Summary (Last 24 hours) at 05/30/2018 1117 Last data filed at 05/30/2018 0900 Gross per 24 hour  Intake 660 ml  Output 500 ml  Net 160 ml   Last 3 Weights 05/30/2018 05/29/2018 05/28/2018  Weight (lbs) 164 lb 4.8 oz 166 lb 6.4 oz 174 lb 12.8 oz  Weight (kg) 74.526 kg 75.479 kg 79.289 kg      Telemetry    SR, 1 run of nsVT 4 beats - Personally Reviewed  ECG    No new tracing - Personally Reviewed  Physical Exam   GEN: No acute distress.   Neck: + 6 cm JVD B/L Cardiac: RRR, no murmurs, rubs, S4 gallops.  Respiratory: Clear to auscultation bilaterally. GI: Soft, nontender, non-distended  MS: Warm extremities, strong pulses, No edema; No deformity. Neuro:  Nonfocal  Psych: Normal affect   Labs    Chemistry Recent Labs  Lab 05/27/18 2017 05/28/18 0102  05/29/18 0417 05/30/18 0605  NA 136 135 137 137  K 4.5 4.4 3.6 4.0  CL 105 107 100 105  CO2 21* 18* 25 23  GLUCOSE 113* 126* 93 91  BUN 14 15 15 17   CREATININE 1.46* 1.45* 1.65* 1.46*  CALCIUM 8.8* 8.9 9.0 8.9  PROT 6.0*  --  6.2* 6.1*  ALBUMIN 3.4*  --  3.3* 3.2*  AST 29  --  30 26  ALT 53*  --  48* 45*  ALKPHOS 52  --  50 47  BILITOT 0.9  --  1.2 1.0  GFRNONAA >60 >60 55* >60  GFRAA >60 >60 >60 >60  ANIONGAP 10 10 12 9      Hematology Recent Labs  Lab 05/28/18 0102 05/29/18 0417 05/30/18 0605  WBC 9.1 7.6 7.1  RBC 5.20 5.27 5.11  HGB 14.9 15.2 15.2  HCT 46.1 45.7 44.2  MCV 88.7 86.7 86.5  MCH 28.7 28.8 29.7  MCHC 32.3 33.3 34.4  RDW 12.9 12.4 12.5  PLT 205 226 236    Cardiac Enzymes Recent Labs  Lab 05/28/18 0102 05/28/18 0712 05/28/18 1309  TROPONINI <0.03 <0.03 <0.03   No results for input(s): TROPIPOC in the last 168 hours.   BNP  Recent Labs  Lab 05/28/18 0102  BNP 1,232.5*     DDimer No results for input(s): DDIMER in the last 168 hours.   Radiology    No results found.  Cardiac Studies     Patient Profile     28 y.o. male with new dg of acute systolic CHF  Assessment & Plan    1. Acute systolic heart failure Presented with 2 to 3 months history of progressive worsening abdominal tightness/pain, orthopnea and PND.  Echocardiogram with severely LV reduced function to 20%, moderately dilated left atrium.  No significant valvular heart disease. -Chest x-ray with pulmonary edema.  BNP elevated. 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 -EKG with sinus rhythm and repolarization abnormality with LVH. -Strict I&O.  Negative 1.5 L, Crea up 1.4 ->1.65, I held lasix yesterday, crea improved to 1.46, I will continue to hold today for a cath tomorrow. Continue spironolactone. - BP soft, he is tolerating Entresto 26/24 mg PO BID - nsVT on telemetry, he will need a Life Vest prior to discharge - no BB yet gicven low  BP, ? Role of ivabradin in an acute CHF, will discuss with CHF team -plan for right and left heart cath on Monday with Dr Gala Romney  I have reviewed the risks, indications, and alternatives to cardiac catheterization and possible angioplasty/stenting with the patient. Risks include but are not limited to bleeding, infection, vascular injury, stroke, myocardial infection, arrhythmia, kidney injury, radiation-related injury in the case of prolonged fluoroscopy use, emergency cardiac surgery, and death. The patient understands the risks of serious complication is low (<1%). He agree with the procedure.   2.  Congenital heart disease -Diagnosed at age 42 after syncope episode.  Will request records.  3.  Alcohol abuse -Advised complete cessation. On CIWA protocol.  4.  Tobacco and marijuana smoking -Advised cessation.  5. AKI -Unknown baseline creatinine.  Creatinine stable at 1.45.  Continue IV Lasix.  Add Entresto as above.  Monitor renal function closely.   For questions or updates, please contact [facility] Please consult [email or web address] for contact info under     Signed, Tobias Alexander, MD  05/30/2018, 11:17 AM

## 2018-05-30 NOTE — Progress Notes (Signed)
PROGRESS NOTE    Bruce Morse  AQT:[id number] DOB: Aug 20, 1990 DOA: 05/27/2018 PCP: Patient, No Pcp Per    Brief Narrative:  HPI per Dr. Lorette Ang is a 28 y.o. 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  The same time patient has been also having shortness of breath on exertion and orthopnea for the same duration of time.  Denies any chest pain productive cough fever chills.  Denies any nausea vomiting or diarrhea.  Abdominal pain is not related to food or defecation.  No weight loss or any blood in the stools.  Patient has not noticed any swelling in the legs.  Patient states that he frequently wakes up in the sleep from shortness of breath last few days.  ED Course: In the ER CT scan of the abdomen and pelvis done shows features concerning for acute pulmonary edema this was confirmed with chest x-ray shows cardiomegaly..  Lipase LFTs were largely normal.  Creatinine 1.4.  Slightly elevated WBC.  UA shows mild proteinuria.  Patient was found to be tachycardic.  An EKG shows sinus tachycardia.  Lasix 60 mg IV was given.  Patient admitted for acute CHF unknown EF.   Assessment & Plan:   Principal Problem:   Acute systolic CHF (congestive heart failure) (HCC) Active Problems:   Left lower quadrant abdominal pain   Acute pulmonary edema (HCC)   Hypoxemia   Alcoholic cardiomyopathy (HCC)   AKI (acute kidney injury) (HCC)   Congenital heart disease   NSVT (nonsustained ventricular tachycardia) (HCC)   Alcohol use  1 acute systolic heart failure/cardiomyopathy Patient with history of congenital heart disease not clearly stated which was diagnosed when he was in [place] the ED with shortness of breath on exertion and orthopnea of unknown duration.  CT abdomen and pelvis which was done showing  features concerning for pulmonary edema which was confirmed by chest x-ray which showed cardiomegaly.  Patient placed on IV Lasix with good diuresis.  Patient is negative for 4.575 L during this hospitalization. Current weight is 74.5 kg from 75.5 kg from 79.3 kg ( 05/28/2018).  2D echo done with a severely reduced systolic function of 20 to 25%, moderately dilated left ventricle, moderate mitral valvular regurgitation, moderate tricuspid valvular regurgitation.  Patient with clinical improvement.  Patient with history of chronic alcohol use and concern for alcoholic cardiomyopathy.  Patient seen in consultation by cardiology and patient is IV diuretics have been discontinued.  Patient placed on spironolactone digoxin and low-dose Entresto per cardiology.  Diuretics on hold in anticipation of right and left heart cardiac catheterization on Monday, 05/31/2018.  Cardiology following.  2.  Congenital heart disease Records pending and have been requested by cardiology.  Patient noted to have had congenital heart disease diagnosed at age 22 after a syncopal episode while playing basketball.  Patient for right and left heart catheterization on Monday, 05/31/2018.  Per cardiology.  3.  Nonsustained V. tach on telemetry Patient currently asymptomatic.  Per cardiology patient will need a LifeVest prior to discharge.  Keep magnesium greater than 2.  Keep potassium greater than 4.  May need to be started on low-dose beta-blocker however will defer to cardiology.  Follow.  4.  Alcohol abuse Complete alcohol cessation stressed to patient.  Patient with no signs of withdrawal.  Continue thiamine,  Folic acid, multivitamin, PPI.  Discontinue the Ativan withdrawal.  5.  Tobacco or marijuana use Complete cessation stressed to patient.  6.  Abdominal pain No clear etiology.  May be secondary to volume overload.  CT abdomen and pelvis unremarkable.  Clinical improvement.  Currently asymptomatic.  Patient started on a PPI.   Follow.  7.  Acute kidney injury Slight bump in creatinine likely may be secondary to diuresis.  IV Lasix has been discontinued.  Urinalysis with 30 of protein.  Creatinine trending down.  Monitor renal function.  Follow.   DVT prophylaxis: Lovenox Code Status: Full Family Communication: Updated patient.  No family at bedside. Disposition Plan: Likely home when okay with cardiology.     Consultants:   Cardiology: Dr. Antoine Poche 05/28/2018  Procedures:  2D echo 05/28/2018  Chest x-ray 05/27/2018  CT abdomen and pelvis 05/27/2018  Antimicrobials:  None   Subjective: Patient sleeping however easily arousable.  Denies any chest pain.  Denies any shortness of breath.  No lightheadedness or dizziness.  Stated he ambulated in the hallway without any problems.  Denies any palpitations.   Objective: Vitals:   05/29/18 1240 05/29/18 1606 05/29/18 1956 05/30/18 0529  BP: 99/70 113/74 110/84 96/72  Pulse: (!) 166 (!) 119 (!) 123 (!) 113  Resp: 18  16 16   Temp: 97.8 F (36.6 C)  98.1 F (36.7 C) 98.3 F (36.8 C)  TempSrc: Oral  Oral Oral  SpO2: 100%  100% 100%  Weight:    74.5 kg  Height:        Intake/Output Summary (Last 24 hours) at 05/30/2018 1249 Last data filed at 05/30/2018 0900 Gross per 24 hour  Intake 660 ml  Output 500 ml  Net 160 ml   Filed Weights   05/28/18 0107 05/29/18 0639 05/30/18 0529  Weight: 79.3 kg 75.5 kg 74.5 kg    Examination:  General exam: NAD Respiratory system: Lungs clear to auscultation bilaterally.  No wheezes, no crackles, no rhonchi.  Cardiovascular system: Regular rate rhythm no murmurs rubs or gallops.  No JVD.  No lower extremity edema. Gastrointestinal system: Abdomen is soft, nontender, nondistended, positive bowel sounds.  No rebound.  No guarding.   Central nervous system: Alert and oriented. No focal neurological deficits. Extremities: Symmetric 5 x 5 power. Skin: No rashes, lesions or ulcers Psychiatry: Judgement and insight appear  normal. Mood & affect appropriate.     Data Reviewed: I have personally reviewed following labs and imaging studies  CBC: Recent Labs  Lab 05/27/18 2017 05/28/18 0102 05/29/18 0417 05/30/18 0605  WBC 10.6* 9.1 7.6 7.1  HGB 14.6 14.9 15.2 15.2  HCT 43.5 46.1 45.7 44.2  MCV 88.4 88.7 86.7 86.5  PLT 213 205 226 236   Basic Metabolic Panel: Recent Labs  Lab 05/27/18 2017 05/28/18 0102 05/29/18 0417 05/30/18 0605  NA 136 135 137 137  K 4.5 4.4 3.6 4.0  CL 105 107 100 105  CO2 21* 18* 25 23  GLUCOSE 113* 126* 93 91  BUN 14 15 15 17   CREATININE 1.46* 1.45* 1.65* 1.46*  CALCIUM 8.8* 8.9 9.0 8.9  MG  --  2.0 2.1 2.1   GFR: Estimated Creatinine Clearance: 72.2 mL/min (A) (by C-G formula based on SCr of 1.46 mg/dL (H)). Liver Function Tests: Recent Labs  Lab 05/27/18 2017 05/29/18 0417 05/30/18 0605  AST 29 30 26   ALT 53* 48* 45*  ALKPHOS 52 50 47  BILITOT 0.9 1.2 1.0  PROT 6.0* 6.2* 6.1*  ALBUMIN 3.4* 3.3* 3.2*   Recent Labs  Lab 05/27/18 2017  LIPASE 34   No results for input(s): AMMONIA in the last 168 hours. Coagulation Profile: No results for input(s): INR, PROTIME in the last 168 hours. Cardiac Enzymes: Recent Labs  Lab 05/28/18 0102 05/28/18 0712 05/28/18 1309  TROPONINI <0.03 <0.03 <0.03   BNP (last 3 results) No results for input(s): PROBNP in the last 8760 hours. HbA1C: No results for input(s): HGBA1C in the last 72 hours. CBG: No results for input(s): GLUCAP in the last 168 hours. Lipid Profile: No results for input(s): CHOL, HDL, LDLCALC, TRIG, CHOLHDL, LDLDIRECT in the last 72 hours. Thyroid Function Tests: Recent Labs    05/28/18 0102  TSH 3.393   Anemia Panel: No results for input(s): VITAMINB12, FOLATE, FERRITIN, TIBC, IRON, RETICCTPCT in the last 72 hours. Sepsis Labs: No results for input(s): PROCALCITON, LATICACIDVEN in the last 168 hours.  No results found for this or any previous visit (from the past 240 hour(s)).        Radiology Studies: No results found.      Scheduled Meds: . digoxin  0.25 mg Oral Daily  . enoxaparin (LOVENOX) injection  30 mg Subcutaneous Q24H  . folic acid  1 mg Oral Daily  . multivitamin with minerals  1 tablet Oral Daily  . pantoprazole  40 mg Oral Q0600  . sacubitril-valsartan  1 tablet Oral BID  . spironolactone  12.5 mg Oral Daily  . thiamine  100 mg Oral Daily   Or  . thiamine  100 mg Intravenous Daily   Continuous Infusions:   LOS: 2 days    Time spent: 35 minutes    Ramiro Harvest, MD [facility]  If 7PM-7AM, please contact night-coverage [email or web address] Password [facility] 05/30/2018, 12:49 PM

## 2018-05-31 ENCOUNTER — Encounter (HOSPITAL_COMMUNITY): Payer: Self-pay | Admitting: Internal Medicine

## 2018-05-31 ENCOUNTER — Encounter (HOSPITAL_COMMUNITY)
Admission: EM | Disposition: A | Discharge status: Home / Self Care | Payer: Self-pay | Source: Home / Self Care | Attending: Internal Medicine

## 2018-05-31 HISTORY — PX: RIGHT/LEFT HEART CATH AND CORONARY ANGIOGRAPHY: CATH118266

## 2018-05-31 LAB — PROTIME-INR
INR: 1.2 (ref 0.8–1.2)
Prothrombin Time: 14.6 seconds (ref 11.4–15.2)

## 2018-05-31 LAB — CBC WITH DIFFERENTIAL/PLATELET
Abs Immature Granulocytes: 0.02 10*3/uL (ref 0.00–0.07)
Basophils Absolute: 0.1 10*3/uL (ref 0.0–0.1)
Basophils Relative: 1 %
EOS PCT: 4 %
Eosinophils Absolute: 0.3 10*3/uL (ref 0.0–0.5)
HCT: 49.4 % (ref 39.0–52.0)
Hemoglobin: 16.3 g/dL (ref 13.0–17.0)
Immature Granulocytes: 0 %
LYMPHS PCT: 34 %
Lymphs Abs: 2.4 10*3/uL (ref 0.7–4.0)
MCH: 28.8 pg (ref 26.0–34.0)
MCHC: 33 g/dL (ref 30.0–36.0)
MCV: 87.3 fL (ref 80.0–100.0)
Monocytes Absolute: 0.8 10*3/uL (ref 0.1–1.0)
Monocytes Relative: 11 %
Neutro Abs: 3.6 10*3/uL (ref 1.7–7.7)
Neutrophils Relative %: 50 %
Platelets: 252 10*3/uL (ref 150–400)
RBC: 5.66 MIL/uL (ref 4.22–5.81)
RDW: 12.3 % (ref 11.5–15.5)
WBC: 7 10*3/uL (ref 4.0–10.5)
nRBC: 0 % (ref 0.0–0.2)

## 2018-05-31 LAB — BASIC METABOLIC PANEL
ANION GAP: 8 (ref 5–15)
BUN: 17 mg/dL (ref 6–20)
CHLORIDE: 104 mmol/L (ref 98–111)
CO2: 23 mmol/L (ref 22–32)
Calcium: 8.8 mg/dL — ABNORMAL LOW (ref 8.9–10.3)
Creatinine, Ser: 1.48 mg/dL — ABNORMAL HIGH (ref 0.61–1.24)
GFR calc Af Amer: >60 mL/min (ref >60)
GFR calc non Af Amer: >60 mL/min (ref >60)
Glucose, Bld: 101 mg/dL — ABNORMAL HIGH (ref 70–99)
Potassium: 4.2 mmol/L (ref 3.5–5.1)
Sodium: 135 mmol/L (ref 135–145)

## 2018-05-31 LAB — MAGNESIUM: Magnesium: 2.1 mg/dL (ref 1.7–2.4)

## 2018-05-31 SURGERY — RIGHT/LEFT HEART CATH AND CORONARY ANGIOGRAPHY
Anesthesia: LOCAL

## 2018-05-31 MED ORDER — ACETAMINOPHEN 325 MG PO TABS: 650.0000 mg | ORAL_TABLET | ORAL | Status: DC | PRN

## 2018-05-31 MED ORDER — IOHEXOL 350 MG/ML SOLN
INTRAVENOUS | Status: DC | PRN
  Administered 2018-05-31: 55 mL via INTRA_ARTERIAL

## 2018-05-31 MED ORDER — HEPARIN (PORCINE) IN NACL 1000-0.9 UT/500ML-% IV SOLN
INTRAVENOUS | Status: DC | PRN
  Administered 2018-05-31: 500 mL

## 2018-05-31 MED ORDER — MIDAZOLAM HCL 2 MG/2ML IJ SOLN
INTRAMUSCULAR | Status: AC
  Filled 2018-05-31: qty 2

## 2018-05-31 MED ORDER — SODIUM CHLORIDE 0.9 % IV SOLN
INTRAVENOUS | Status: AC
  Administered 2018-05-31: 14:00:00 via INTRAVENOUS

## 2018-05-31 MED ORDER — HEPARIN SODIUM (PORCINE) 1000 UNIT/ML IJ SOLN
INTRAMUSCULAR | Status: DC | PRN
  Administered 2018-05-31: 4000 [IU] via INTRAVENOUS

## 2018-05-31 MED ORDER — ONDANSETRON HCL 4 MG/2ML IJ SOLN: 4.0000 mg | Freq: Four times a day (QID) | INTRAMUSCULAR | Status: DC | PRN

## 2018-05-31 MED ORDER — MIDAZOLAM HCL 2 MG/2ML IJ SOLN
INTRAMUSCULAR | Status: DC | PRN
  Administered 2018-05-31: 1 mg via INTRAVENOUS
  Administered 2018-05-31: 2 mg via INTRAVENOUS

## 2018-05-31 MED ORDER — HEPARIN (PORCINE) IN NACL 1000-0.9 UT/500ML-% IV SOLN
INTRAVENOUS | Status: AC
  Filled 2018-05-31: qty 500

## 2018-05-31 MED ORDER — LIDOCAINE HCL (PF) 1 % IJ SOLN
INTRAMUSCULAR | Status: DC | PRN
  Administered 2018-05-31 (×2): 2 mL

## 2018-05-31 MED ORDER — FENTANYL CITRATE (PF) 100 MCG/2ML IJ SOLN
INTRAMUSCULAR | Status: DC | PRN
  Administered 2018-05-31 (×2): 25 ug via INTRAVENOUS

## 2018-05-31 MED ORDER — POTASSIUM CHLORIDE CRYS ER 20 MEQ PO TBCR: 40.0000 meq | EXTENDED_RELEASE_TABLET | Freq: Once | ORAL | Status: DC

## 2018-05-31 MED ORDER — VERAPAMIL HCL 2.5 MG/ML IV SOLN
INTRAVENOUS | Status: DC | PRN
  Administered 2018-05-31: 10 mL via INTRA_ARTERIAL

## 2018-05-31 MED ORDER — SODIUM CHLORIDE 0.9% FLUSH: 3.0000 mL | INTRAVENOUS | Status: DC | PRN

## 2018-05-31 MED ORDER — VERAPAMIL HCL 2.5 MG/ML IV SOLN
INTRAVENOUS | Status: AC
  Filled 2018-05-31: qty 2

## 2018-05-31 MED ORDER — HEPARIN SODIUM (PORCINE) 1000 UNIT/ML IJ SOLN
INTRAMUSCULAR | Status: AC
  Filled 2018-05-31: qty 1

## 2018-05-31 MED ORDER — SODIUM CHLORIDE 0.9% FLUSH
3.0000 mL | Freq: Two times a day (BID) | INTRAVENOUS | Status: DC
  Administered 2018-05-31 – 2018-06-02 (×4): 3 mL via INTRAVENOUS

## 2018-05-31 MED ORDER — FENTANYL CITRATE (PF) 100 MCG/2ML IJ SOLN
INTRAMUSCULAR | Status: AC
  Filled 2018-05-31: qty 2

## 2018-05-31 MED ORDER — SODIUM CHLORIDE 0.9 % IV SOLN: 250.0000 mL | INTRAVENOUS | Status: DC | PRN

## 2018-05-31 MED ORDER — LIDOCAINE HCL (PF) 1 % IJ SOLN
INTRAMUSCULAR | Status: AC
  Filled 2018-05-31: qty 30

## 2018-05-31 SURGICAL SUPPLY — 13 items
CATH 5FR JL3.5 JR4 ANG PIG MP (CATHETERS) ×2 IMPLANT
CATH BALLN WEDGE 5F 110CM (CATHETERS) ×2 IMPLANT
CATH LAUNCHER 5F JL3 (CATHETERS) ×1 IMPLANT
CATHETER LAUNCHER 5F JL3 (CATHETERS) ×2
DEVICE RAD COMP TR BAND LRG (VASCULAR PRODUCTS) ×2 IMPLANT
GLIDESHEATH SLEND SS 6F .021 (SHEATH) ×2 IMPLANT
GUIDEWIRE INQWIRE 1.5J.035X260 (WIRE) ×1 IMPLANT
INQWIRE 1.5J .035X260CM (WIRE) ×2
KIT HEART LEFT (KITS) ×2 IMPLANT
PACK CARDIAC CATHETERIZATION (CUSTOM PROCEDURE TRAY) ×2 IMPLANT
SHEATH GLIDE SLENDER 4/5FR (SHEATH) ×2 IMPLANT
TRANSDUCER W/STOPCOCK (MISCELLANEOUS) ×2 IMPLANT
TUBING CIL FLEX 10 FLL-RA (TUBING) ×2 IMPLANT

## 2018-05-31 NOTE — Interval H&P Note (Signed)
History and Physical Interval Note:  05/31/2018 12:42 PM  Bruce Morse  has presented today for surgery, with the diagnosis of Heart failure.  The various methods of treatment have been discussed with the patient and family. After consideration of risks, benefits and other options for treatment, the patient has consented to  Procedure(s): RIGHT/LEFT HEART CATH AND CORONARY ANGIOGRAPHY (N/A) possible coronary angioplasty as a surgical intervention.  The patient's history has been reviewed, patient examined, no change in status, stable for surgery.  I have reviewed the patient's chart and labs.  Questions were answered to the patient's satisfaction.     Stokes Rattigan

## 2018-05-31 NOTE — Progress Notes (Signed)
PROGRESS NOTE    Bruce Morse  BWG:[id number] DOB: 1990-05-16 DOA: 05/27/2018 PCP: Patient, No Pcp Per    Brief Narrative:  HPI per Dr. Lorette Ang is a 28 y.o. 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  The same time patient has been also having shortness of breath on exertion and orthopnea for the same duration of time.  Denies any chest pain productive cough fever chills.  Denies any nausea vomiting or diarrhea.  Abdominal pain is not related to food or defecation.  No weight loss or any blood in the stools.  Patient has not noticed any swelling in the legs.  Patient states that he frequently wakes up in the sleep from shortness of breath last few days.  ED Course: In the ER CT scan of the abdomen and pelvis done shows features concerning for acute pulmonary edema this was confirmed with chest x-ray shows cardiomegaly..  Lipase LFTs were largely normal.  Creatinine 1.4.  Slightly elevated WBC.  UA shows mild proteinuria.  Patient was found to be tachycardic.  An EKG shows sinus tachycardia.  Lasix 60 mg IV was given.  Patient admitted for acute CHF unknown EF.   Assessment & Plan:   Principal Problem:   Acute systolic CHF (congestive heart failure) (HCC) Active Problems:   Left lower quadrant abdominal pain   Acute pulmonary edema (HCC)   Hypoxemia   Alcoholic cardiomyopathy (HCC)   AKI (acute kidney injury) (HCC)   Congenital heart disease   NSVT (nonsustained ventricular tachycardia) (HCC)   Alcohol use  1 acute systolic heart failure/cardiomyopathy Patient with history of congenital heart disease not clearly stated which was diagnosed when he was in [place] the ED with shortness of breath on exertion and orthopnea of unknown duration.  CT abdomen and pelvis which was done showing  features concerning for pulmonary edema which was confirmed by chest x-ray which showed cardiomegaly.  Patient placed on IV Lasix with good diuresis.  Patient with a urine output of 2.3 L over the past 24 hours.  Patient is negative for 5.515 L during this hospitalization. Current weight is 75.1 kg from 74.5 kg from 75.5 kg from 79.3 kg ( 05/28/2018).  2D echo done with a severely reduced systolic function of 20 to 25%, moderately dilated left ventricle, moderate mitral valvular regurgitation, moderate tricuspid valvular regurgitation.  Patient with clinical improvement.  Patient with history of chronic alcohol use and concern for alcoholic cardiomyopathy.  Patient seen in consultation by cardiology and patient was on IV diuretics which have been discontinued.  Patient placed on spironolactone digoxin and low-dose Entresto per cardiology.  Diuretics on hold in anticipation of right and left heart cardiac catheterization today , 05/31/2018.  Cardiology following.  2.  Congenital heart disease Records pending and have been requested by cardiology.  Patient noted to have had congenital heart disease diagnosed at age 46 after a syncopal episode while playing basketball.  Patient for right and left heart catheterization today, 05/31/2018.  Per cardiology.  3.  Nonsustained V. tach on telemetry Patient currently asymptomatic.  Per cardiology patient will need a LifeVest prior to discharge.  Keep magnesium greater than 2.  Keep potassium greater than 4.  May need to be started on low-dose beta-blocker however will defer to cardiology.  Follow.  4.  Alcohol  abuse Complete alcohol cessation stressed to patient.  Patient with no signs of withdrawal.  Continue thiamine,  Folic acid, multivitamin, PPI.  Discontinued the Ativan withdrawal.  5.  Tobacco or marijuana use Complete cessation stressed to patient.  6.  Abdominal pain No clear etiology.  May be secondary to volume overload versus gastritis..  CT abdomen and  pelvis unremarkable.  Clinical improvement.  Currently asymptomatic.  Continue PPI.   7.  Acute kidney injury Slight bump in creatinine likely may be secondary to diuresis.  IV Lasix has been discontinued.  Urinalysis with 30 of protein.  Creatinine seems to be plateauing at 1.48.  Follow renal function.     DVT prophylaxis: Lovenox Code Status: Full Family Communication: Updated patient.  No family at bedside. Disposition Plan: Likely home when okay with cardiology.     Consultants:   Cardiology: Dr. Antoine Poche 05/28/2018  Procedures:  2D echo 05/28/2018  Chest x-ray 05/27/2018  CT abdomen and pelvis 05/27/2018  Right and left heart cardiac catheterization pending  Antimicrobials:  None   Subjective: Patient sleeping however easily arousable.  Denies any chest pain.  Denies any shortness of breath.  Denies any dizziness or lightheadedness.  No palpitations.  States he is hungry.  Patient denies any further abdominal pain.  Objective: Vitals:   05/30/18 2200 05/31/18 0000 05/31/18 0656 05/31/18 0722  BP: 104/69 101/75 97/68   Pulse: (!) 118 (!) 112 (!) 113   Resp: 18  18   Temp: 97.9 F (36.6 C)  98 F (36.7 C)   TempSrc: Oral  Oral   SpO2: 100%  100%   Weight:    75.1 kg  Height:        Intake/Output Summary (Last 24 hours) at 05/31/2018 0847 Last data filed at 05/31/2018 0700 Gross per 24 hour  Intake 840 ml  Output 2300 ml  Net -1460 ml   Filed Weights   05/29/18 0639 05/30/18 0529 05/31/18 0722  Weight: 75.5 kg 74.5 kg 75.1 kg    Examination:  General exam: NAD Respiratory system: CTA B.  No wheezes, no crackles, no rhonchi.  Normal respiratory effort.  No use of accessory muscles of respiration.   Cardiovascular system: Tachycardia.  No JVD.  No lower extremity edema.  No murmurs rubs or gallops.  Gastrointestinal system: Abdomen is nontender, nondistended, soft, positive bowel sounds.  No rebound.  No guarding.  Central nervous system: Alert and oriented. No  focal neurological deficits. Extremities: Symmetric 5 x 5 power. Skin: No rashes, lesions or ulcers Psychiatry: Judgement and insight appear normal. Mood & affect appropriate.     Data Reviewed: I have personally reviewed following labs and imaging studies  CBC: Recent Labs  Lab 05/27/18 2017 05/28/18 0102 05/29/18 0417 05/30/18 0605 05/31/18 0438  WBC 10.6* 9.1 7.6 7.1 7.0  NEUTROABS  --   --   --   --  3.6  HGB 14.6 14.9 15.2 15.2 16.3  HCT 43.5 46.1 45.7 44.2 49.4  MCV 88.4 88.7 86.7 86.5 87.3  PLT 213 205 226 236 252   Basic Metabolic Panel: Recent Labs  Lab 05/27/18 2017 05/28/18 0102 05/29/18 0417 05/30/18 0605 05/31/18 0438  NA 136 135 137 137 135  K 4.5 4.4 3.6 4.0 4.2  CL 105 107 100 105 104  CO2 21* 18* 25 23 23   GLUCOSE 113* 126* 93 91 101*  BUN 14 15 15 17 17   CREATININE 1.46* 1.45* 1.65* 1.46* 1.48*  CALCIUM 8.8* 8.9 9.0 8.9 8.8*  MG  --  2.0 2.1 2.1 2.1   GFR: Estimated Creatinine Clearance: 71.3 mL/min (A) (by C-G formula based on SCr of 1.48 mg/dL (H)). Liver Function Tests: Recent Labs  Lab 05/27/18 2017 05/29/18 0417 05/30/18 0605  AST 29 30 26   ALT 53* 48* 45*  ALKPHOS 52 50 47  BILITOT 0.9 1.2 1.0  PROT 6.0* 6.2* 6.1*  ALBUMIN 3.4* 3.3* 3.2*   Recent Labs  Lab 05/27/18 2017  LIPASE 34   No results for input(s): AMMONIA in the last 168 hours. Coagulation Profile: Recent Labs  Lab 05/31/18 0438  INR 1.2   Cardiac Enzymes: Recent Labs  Lab 05/28/18 0102 05/28/18 0712 05/28/18 1309  TROPONINI <0.03 <0.03 <0.03   BNP (last 3 results) No results for input(s): PROBNP in the last 8760 hours. HbA1C: No results for input(s): HGBA1C in the last 72 hours. CBG: No results for input(s): GLUCAP in the last 168 hours. Lipid Profile: No results for input(s): CHOL, HDL, LDLCALC, TRIG, CHOLHDL, LDLDIRECT in the last 72 hours. Thyroid Function Tests: No results for input(s): TSH, T4TOTAL, FREET4, T3FREE, THYROIDAB in the last 72  hours. Anemia Panel: No results for input(s): VITAMINB12, FOLATE, FERRITIN, TIBC, IRON, RETICCTPCT in the last 72 hours. Sepsis Labs: No results for input(s): PROCALCITON, LATICACIDVEN in the last 168 hours.  No results found for this or any previous visit (from the past 240 hour(s)).       Radiology Studies: No results found.      Scheduled Meds: . digoxin  0.25 mg Oral Daily  . enoxaparin (LOVENOX) injection  40 mg Subcutaneous Q24H  . folic acid  1 mg Oral Daily  . multivitamin with minerals  1 tablet Oral Daily  . pantoprazole  40 mg Oral Q0600  . sacubitril-valsartan  1 tablet Oral BID  . sodium chloride flush  3 mL Intravenous Q12H  . spironolactone  12.5 mg Oral Daily  . thiamine  100 mg Oral Daily   Or  . thiamine  100 mg Intravenous Daily   Continuous Infusions: . sodium chloride    . sodium chloride 10 mL/hr at 05/31/18 0714     LOS: 3 days    Time spent: 35 minutes    Ramiro Harvest, MD [facility]  If 7PM-7AM, please contact night-coverage [email or web address] Password [facility] 05/31/2018, 8:47 AM

## 2018-05-31 NOTE — Progress Notes (Addendum)
Transport here to pick up patient to cath lab. IV infusing.pt on room air.

## 2018-05-31 NOTE — Progress Notes (Signed)
Patient is back to the unit. Alert and oriented. Currently ordering food.   05/31/18 1335  First Vascular Site Assessment  #1 - Location of Site Assessment Right radial  #1 - Vascular Site Assessment Scale Level 0  #1 - Air in TR Band 14 cc     05/31/18 1335  Second Vascular Site Assessment  #2 - Location of Site Assessment Right brachial  #2 - Vascular Site Assessment Scale Level 0

## 2018-05-31 NOTE — H&P (View-Only) (Signed)
Progress Note  Patient Name: Bruce Morse Date of Encounter: 05/31/2018  Primary Cardiologist: new pt/ Dr Antoine Poche  Subjective   No issues overnight - plan for [facility] today with Dr. Gala Romney. Creatinine stable at 1.48.   Inpatient Medications    Scheduled Meds: . digoxin  0.25 mg Oral Daily  . enoxaparin (LOVENOX) injection  40 mg Subcutaneous Q24H  . folic acid  1 mg Oral Daily  . multivitamin with minerals  1 tablet Oral Daily  . pantoprazole  40 mg Oral Q0600  . sacubitril-valsartan  1 tablet Oral BID  . sodium chloride flush  3 mL Intravenous Q12H  . spironolactone  12.5 mg Oral Daily  . thiamine  100 mg Oral Daily   Or  . thiamine  100 mg Intravenous Daily   Continuous Infusions: . sodium chloride    . sodium chloride 10 mL/hr at 05/31/18 0714   PRN Meds: sodium chloride, acetaminophen **OR** acetaminophen, ondansetron **OR** ondansetron (ZOFRAN) IV, sodium chloride flush   Vital Signs    Vitals:   05/30/18 2200 05/31/18 0000 05/31/18 0656 05/31/18 0722  BP: 104/69 101/75 97/68   Pulse: (!) 118 (!) 112 (!) 113   Resp: 18  18   Temp: 97.9 F (36.6 C)  98 F (36.7 C)   TempSrc: Oral  Oral   SpO2: 100%  100%   Weight:    75.1 kg  Height:        Intake/Output Summary (Last 24 hours) at 05/31/2018 0939 Last data filed at 05/31/2018 0700 Gross per 24 hour  Intake 600 ml  Output 1800 ml  Net -1200 ml   Last 3 Weights 05/31/2018 05/30/2018 05/29/2018  Weight (lbs) 165 lb 8 oz 164 lb 4.8 oz 166 lb 6.4 oz  Weight (kg) 75.07 kg 74.526 kg 75.479 kg      Telemetry    Sinus with occasional NSVT - Personally Reviewed  ECG    N/A  Physical Exam   General appearance: sleepy, awakens easily, NAD Neck: JVD - 3 cm above sternal notch, no carotid bruit and thyroid not enlarged, symmetric, no tenderness/mass/nodules Lungs: diminished breath sounds bilaterally Heart: regular tachycardia Abdomen: soft, non-tender; bowel sounds normal; no masses,  no organomegaly  and scaphoid Extremities: extremities normal, atraumatic, no cyanosis or edema Pulses: 2+ and symmetric Skin: Skin color, texture, turgor normal. No rashes or lesions Neurologic: Grossly normal Psych: Pleasant   Labs    Chemistry Recent Labs  Lab 05/27/18 2017  05/29/18 0417 05/30/18 0605 05/31/18 0438  NA 136   < > 137 137 135  K 4.5   < > 3.6 4.0 4.2  CL 105   < > 100 105 104  CO2 21*   < > 25 23 23   GLUCOSE 113*   < > 93 91 101*  BUN 14   < > 15 17 17   CREATININE 1.46*   < > 1.65* 1.46* 1.48*  CALCIUM 8.8*   < > 9.0 8.9 8.8*  PROT 6.0*  --  6.2* 6.1*  --   ALBUMIN 3.4*  --  3.3* 3.2*  --   AST 29  --  30 26  --   ALT 53*  --  48* 45*  --   ALKPHOS 52  --  50 47  --   BILITOT 0.9  --  1.2 1.0  --   GFRNONAA >60   < > 55* >60 >60  GFRAA >60   < > >60 >60 >60  ANIONGAP  10   < > 12 9 8    < > = values in this interval not displayed.     Hematology Recent Labs  Lab 05/29/18 0417 05/30/18 0605 05/31/18 0438  WBC 7.6 7.1 7.0  RBC 5.27 5.11 5.66  HGB 15.2 15.2 16.3  HCT 45.7 44.2 49.4  MCV 86.7 86.5 87.3  MCH 28.8 29.7 28.8  MCHC 33.3 34.4 33.0  RDW 12.4 12.5 12.3  PLT 226 236 252    Cardiac Enzymes Recent Labs  Lab 05/28/18 0102 05/28/18 0712 05/28/18 1309  TROPONINI <0.03 <0.03 <0.03   No results for input(s): TROPIPOC in the last 168 hours.   BNP Recent Labs  Lab 05/28/18 0102  BNP 1,232.5*     DDimer No results for input(s): DDIMER in the last 168 hours.   Radiology    No results found.  Cardiac Studies   ECHOCARDIOGRAM REPORT       Patient Name:   NIKE NEWHART Date of Exam: 05/28/2018 Medical Rec #:  [id number]        Height:       68.0 in Accession #:    [id number]       Weight:       174.8 lb Date of Birth:  January 14, 1991       BSA:          1.93 m Patient Age:    28 years         BP:           115/72 mmHg Patient Gender: M                HR:           117 bpm. Exam Location:  Inpatient    Procedure: 2D  Echo  Indications:    Dyspnea 786.09 / R06.00   History:        Patient has no prior history of Echocardiogram examinations.                 CHF. Pulmonary edema.   Sonographer:    Tobin Chad Referring Phys: 52 ARSHAD N KAKRAKANDY  IMPRESSIONS    1. The left ventricle has severely reduced systolic function, with an ejection fraction of 20-25%. The cavity size was moderately dilated. Left ventricular diastolic Doppler parameters are consistent with pseudonormalization.  2. The right ventricle has normal systolic function. The cavity was normal. There is no increase in right ventricular wall thickness.  3. Left atrial size was mildly dilated.  4. The mitral valve is degenerative. Mild thickening of the mitral valve leaflet. Mitral valve regurgitation is moderate by color flow Doppler.  5. The tricuspid valve is normal in structure. Tricuspid valve regurgitation is moderate.  6. The aortic valve is tricuspid Mild thickening of the aortic valve Aortic valve regurgitation is trivial by color flow Doppler.  7. The pulmonic valve was normal in structure.  Patient Profile     28 y.o. male with new acute systolic CHF - LVEF 20-25%, possibly etoh cardiomyopathy vs. Tachy-mediated  Assessment & Plan    1. Acute systolic heart failure Presented with 2 to 3 months history of progressive worsening abdominal tightness/pain, orthopnea and PND.  Echocardiogram with severely LV reduced function to 20%, moderately dilated left atrium.  No significant valvular heart disease. -Chest x-ray with pulmonary edema.  BNP elevated. 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 -EKG with sinus rhythm and repolarization abnormality with LVH. -Strict I&O.  Negative 1.5L overnight- now 5.5L  negative. Creatinine stalbe - BP soft, he is tolerating Entresto 26/24 mg PO BID - nsVT on telemetry, he will need a Life Vest prior to discharge - may consider adding ivabradine -plan  for right and left heart cath today with Dr Gala Romney  I have reviewed the risks, indications, and alternatives to cardiac catheterization and possible angioplasty/stenting with the patient. Risks include but are not limited to bleeding, infection, vascular injury, stroke, myocardial infection, arrhythmia, kidney injury, radiation-related injury in the case of prolonged fluoroscopy use, emergency cardiac surgery, and death. The patient understands the risks of serious complication is low (<1%). He agree with the procedure.   2.  Congenital heart disease -Diagnosed at age 46 after syncope episode.  Records pending  3.  Alcohol abuse -Advised complete cessation. On CIWA protocol.  4.  Tobacco and marijuana smoking -Advised cessation.  5. AKI -Unknown baseline creatinine.  Creatinine stable at 1.45.  Continue IV Lasix.  Add Entresto as above.  Monitor renal function closely.   For questions or updates, please contact [facility] Please consult [email or web address] for contact info under     Chrystie Nose, MD, Milagros Loll  [place]  [facility]  Medical Director of the Advanced Lipid Disorders &  Cardiovascular Risk Reduction Clinic Diplomate of the American Board of Clinical Lipidology Attending Cardiologist  Direct Dial: [phone]  Fax: [phone]  Website:  [email or web address]  Chrystie Nose, MD  05/31/2018, 9:39 AM

## 2018-05-31 NOTE — Progress Notes (Signed)
 Progress Note  Patient Name: Bruce Morse Date of Encounter: 05/31/2018  Primary Cardiologist: new pt/ Dr Hochrein  Subjective   No issues overnight - plan for L/RHC today with Dr. Bensimhon. Creatinine stable at 1.48.   Inpatient Medications    Scheduled Meds: . digoxin  0.25 mg Oral Daily  . enoxaparin (LOVENOX) injection  40 mg Subcutaneous Q24H  . folic acid  1 mg Oral Daily  . multivitamin with minerals  1 tablet Oral Daily  . pantoprazole  40 mg Oral Q0600  . sacubitril-valsartan  1 tablet Oral BID  . sodium chloride flush  3 mL Intravenous Q12H  . spironolactone  12.5 mg Oral Daily  . thiamine  100 mg Oral Daily   Or  . thiamine  100 mg Intravenous Daily   Continuous Infusions: . sodium chloride    . sodium chloride 10 mL/hr at 05/31/18 0714   PRN Meds: sodium chloride, acetaminophen **OR** acetaminophen, ondansetron **OR** ondansetron (ZOFRAN) IV, sodium chloride flush   Vital Signs    Vitals:   05/30/18 2200 05/31/18 0000 05/31/18 0656 05/31/18 0722  BP: 104/69 101/75 97/68   Pulse: (!) 118 (!) 112 (!) 113   Resp: 18  18   Temp: 97.9 F (36.6 C)  98 F (36.7 C)   TempSrc: Oral  Oral   SpO2: 100%  100%   Weight:    75.1 kg  Height:        Intake/Output Summary (Last 24 hours) at 05/31/2018 0939 Last data filed at 05/31/2018 0700 Gross per 24 hour  Intake 600 ml  Output 1800 ml  Net -1200 ml   Last 3 Weights 05/31/2018 05/30/2018 05/29/2018  Weight (lbs) 165 lb 8 oz 164 lb 4.8 oz 166 lb 6.4 oz  Weight (kg) 75.07 kg 74.526 kg 75.479 kg      Telemetry    Sinus with occasional NSVT - Personally Reviewed  ECG    N/A  Physical Exam   General appearance: sleepy, awakens easily, NAD Neck: JVD - 3 cm above sternal notch, no carotid bruit and thyroid not enlarged, symmetric, no tenderness/mass/nodules Lungs: diminished breath sounds bilaterally Heart: regular tachycardia Abdomen: soft, non-tender; bowel sounds normal; no masses,  no organomegaly  and scaphoid Extremities: extremities normal, atraumatic, no cyanosis or edema Pulses: 2+ and symmetric Skin: Skin color, texture, turgor normal. No rashes or lesions Neurologic: Grossly normal Psych: Pleasant   Labs    Chemistry Recent Labs  Lab 05/27/18 2017  05/29/18 0417 05/30/18 0605 05/31/18 0438  NA 136   < > 137 137 135  K 4.5   < > 3.6 4.0 4.2  CL 105   < > 100 105 104  CO2 21*   < > 25 23 23  GLUCOSE 113*   < > 93 91 101*  BUN 14   < > 15 17 17  CREATININE 1.46*   < > 1.65* 1.46* 1.48*  CALCIUM 8.8*   < > 9.0 8.9 8.8*  PROT 6.0*  --  6.2* 6.1*  --   ALBUMIN 3.4*  --  3.3* 3.2*  --   AST 29  --  30 26  --   ALT 53*  --  48* 45*  --   ALKPHOS 52  --  50 47  --   BILITOT 0.9  --  1.2 1.0  --   GFRNONAA >60   < > 55* >60 >60  GFRAA >60   < > >60 >60 >60  ANIONGAP   10   < > 12 9 8    < > = values in this interval not displayed.     Hematology Recent Labs  Lab 05/29/18 0417 05/30/18 0605 05/31/18 0438  WBC 7.6 7.1 7.0  RBC 5.27 5.11 5.66  HGB 15.2 15.2 16.3  HCT 45.7 44.2 49.4  MCV 86.7 86.5 87.3  MCH 28.8 29.7 28.8  MCHC 33.3 34.4 33.0  RDW 12.4 12.5 12.3  PLT 226 236 252    Cardiac Enzymes Recent Labs  Lab 05/28/18 0102 05/28/18 0712 05/28/18 1309  TROPONINI <0.03 <0.03 <0.03   No results for input(s): TROPIPOC in the last 168 hours.   BNP Recent Labs  Lab 05/28/18 0102  BNP 1,232.5*     DDimer No results for input(s): DDIMER in the last 168 hours.   Radiology    No results found.  Cardiac Studies   ECHOCARDIOGRAM REPORT       Patient Name:   NIKE NEWHART Date of Exam: 05/28/2018 Medical Rec #:  [id number]        Height:       68.0 in Accession #:    [id number]       Weight:       174.8 lb Date of Birth:  January 14, 1991       BSA:          1.93 m Patient Age:    28 years         BP:           115/72 mmHg Patient Gender: M                HR:           117 bpm. Exam Location:  Inpatient    Procedure: 2D  Echo  Indications:    Dyspnea 786.09 / R06.00   History:        Patient has no prior history of Echocardiogram examinations.                 CHF. Pulmonary edema.   Sonographer:    Tobin Chad Referring Phys: 52 ARSHAD N KAKRAKANDY  IMPRESSIONS    1. The left ventricle has severely reduced systolic function, with an ejection fraction of 20-25%. The cavity size was moderately dilated. Left ventricular diastolic Doppler parameters are consistent with pseudonormalization.  2. The right ventricle has normal systolic function. The cavity was normal. There is no increase in right ventricular wall thickness.  3. Left atrial size was mildly dilated.  4. The mitral valve is degenerative. Mild thickening of the mitral valve leaflet. Mitral valve regurgitation is moderate by color flow Doppler.  5. The tricuspid valve is normal in structure. Tricuspid valve regurgitation is moderate.  6. The aortic valve is tricuspid Mild thickening of the aortic valve Aortic valve regurgitation is trivial by color flow Doppler.  7. The pulmonic valve was normal in structure.  Patient Profile     28 y.o. male with new acute systolic CHF - LVEF 20-25%, possibly etoh cardiomyopathy vs. Tachy-mediated  Assessment & Plan    1. Acute systolic heart failure Presented with 2 to 3 months history of progressive worsening abdominal tightness/pain, orthopnea and PND.  Echocardiogram with severely LV reduced function to 20%, moderately dilated left atrium.  No significant valvular heart disease. -Chest x-ray with pulmonary edema.  BNP elevated. 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 -EKG with sinus rhythm and repolarization abnormality with LVH. -Strict I&O.  Negative 1.5L overnight- now 5.5L  negative. Creatinine stalbe - BP soft, he is tolerating Entresto 26/24 mg PO BID - nsVT on telemetry, he will need a Life Vest prior to discharge - may consider adding ivabradine -plan  for right and left heart cath today with Dr Gala Romney  I have reviewed the risks, indications, and alternatives to cardiac catheterization and possible angioplasty/stenting with the patient. Risks include but are not limited to bleeding, infection, vascular injury, stroke, myocardial infection, arrhythmia, kidney injury, radiation-related injury in the case of prolonged fluoroscopy use, emergency cardiac surgery, and death. The patient understands the risks of serious complication is low (<1%). He agree with the procedure.   2.  Congenital heart disease -Diagnosed at age 46 after syncope episode.  Records pending  3.  Alcohol abuse -Advised complete cessation. On CIWA protocol.  4.  Tobacco and marijuana smoking -Advised cessation.  5. AKI -Unknown baseline creatinine.  Creatinine stable at 1.45.  Continue IV Lasix.  Add Entresto as above.  Monitor renal function closely.   For questions or updates, please contact [facility] Please consult [email or web address] for contact info under     Chrystie Nose, MD, Milagros Loll  [place]  [facility]  Medical Director of the Advanced Lipid Disorders &  Cardiovascular Risk Reduction Clinic Diplomate of the American Board of Clinical Lipidology Attending Cardiologist  Direct Dial: [phone]  Fax: [phone]  Website:  [email or web address]  Chrystie Nose, MD  05/31/2018, 9:39 AM

## 2018-06-01 DIAGNOSIS — I428 Other cardiomyopathies: Secondary | ICD-10-CM

## 2018-06-01 DIAGNOSIS — I472 Ventricular tachycardia: Secondary | ICD-10-CM

## 2018-06-01 DIAGNOSIS — N179 Acute kidney failure, unspecified: Secondary | ICD-10-CM

## 2018-06-01 DIAGNOSIS — Q249 Congenital malformation of heart, unspecified: Secondary | ICD-10-CM

## 2018-06-01 LAB — POCT I-STAT EG7
Acid-base deficit: 14 mmol/L — ABNORMAL HIGH (ref 0.0–2.0)
Acid-base deficit: 6 mmol/L — ABNORMAL HIGH (ref 0.0–2.0)
Bicarbonate: 13.4 mmol/L — ABNORMAL LOW (ref 20.0–28.0)
Bicarbonate: 20.6 mmol/L (ref 20.0–28.0)
Calcium, Ion: 0.48 mmol/L — CL (ref 1.15–1.40)
Calcium, Ion: 1 mmol/L — ABNORMAL LOW (ref 1.15–1.40)
HCT: 39 % (ref 39.0–52.0)
HCT: 49 % (ref 39.0–52.0)
Hemoglobin: 13.3 g/dL (ref 13.0–17.0)
Hemoglobin: 16.7 g/dL (ref 13.0–17.0)
O2 Saturation: 69 %
O2 Saturation: 72 %
PCO2 VEN: 34.1 mmHg — AB (ref 44.0–60.0)
PO2 VEN: 43 mmHg (ref 32.0–45.0)
Potassium: 2.3 mmol/L — CL (ref 3.5–5.1)
Potassium: 3.9 mmol/L (ref 3.5–5.1)
Sodium: 126 mmol/L — ABNORMAL LOW (ref 135–145)
Sodium: 129 mmol/L — ABNORMAL LOW (ref 135–145)
TCO2: 14 mmol/L — ABNORMAL LOW (ref 22–32)
TCO2: 22 mmol/L (ref 22–32)
pCO2, Ven: 43.1 mmHg — ABNORMAL LOW (ref 44.0–60.0)
pH, Ven: 7.204 — ABNORMAL LOW (ref 7.250–7.430)
pH, Ven: 7.288 (ref 7.250–7.430)
pO2, Ven: 43 mmHg (ref 32.0–45.0)

## 2018-06-01 LAB — POCT I-STAT 7, (LYTES, BLD GAS, ICA,H+H)
Acid-base deficit: 2 mmol/L (ref 0.0–2.0)
Bicarbonate: 21.7 mmol/L (ref 20.0–28.0)
Calcium, Ion: 0.96 mmol/L — ABNORMAL LOW (ref 1.15–1.40)
HCT: 48 % (ref 39.0–52.0)
Hemoglobin: 16.3 g/dL (ref 13.0–17.0)
O2 SAT: 99 %
PH ART: 7.404 (ref 7.350–7.450)
Potassium: 4.1 mmol/L (ref 3.5–5.1)
Sodium: 141 mmol/L (ref 135–145)
TCO2: 23 mmol/L (ref 22–32)
pCO2 arterial: 34.7 mmHg (ref 32.0–48.0)
pO2, Arterial: 117 mmHg — ABNORMAL HIGH (ref 83.0–108.0)

## 2018-06-01 LAB — BASIC METABOLIC PANEL
ANION GAP: 11 (ref 5–15)
BUN: 15 mg/dL (ref 6–20)
CO2: 22 mmol/L (ref 22–32)
Calcium: 9.2 mg/dL (ref 8.9–10.3)
Chloride: 102 mmol/L (ref 98–111)
Creatinine, Ser: 1.32 mg/dL — ABNORMAL HIGH (ref 0.61–1.24)
GFR calc Af Amer: >60 mL/min (ref >60)
Glucose, Bld: 92 mg/dL (ref 70–99)
Potassium: 4.5 mmol/L (ref 3.5–5.1)
Sodium: 135 mmol/L (ref 135–145)

## 2018-06-01 LAB — CBC
HCT: 52.1 % — ABNORMAL HIGH (ref 39.0–52.0)
Hemoglobin: 17.1 g/dL — ABNORMAL HIGH (ref 13.0–17.0)
MCH: 28.8 pg (ref 26.0–34.0)
MCHC: 32.8 g/dL (ref 30.0–36.0)
MCV: 87.9 fL (ref 80.0–100.0)
NRBC: 0 % (ref 0.0–0.2)
Platelets: 274 10*3/uL (ref 150–400)
RBC: 5.93 MIL/uL — ABNORMAL HIGH (ref 4.22–5.81)
RDW: 12.4 % (ref 11.5–15.5)
WBC: 8.6 10*3/uL (ref 4.0–10.5)

## 2018-06-01 MED ORDER — CARVEDILOL 3.125 MG PO TABS: 3.1250 mg | ORAL_TABLET | Freq: Two times a day (BID) | ORAL | Status: DC

## 2018-06-01 NOTE — Progress Notes (Signed)
    Pt will need Life Vest placement at discharge given new LVEF of 20-25% per echocardiogram 05/28/2018. [employer] rep contacted for placement and fitting. Will need assistance with coverage as patient is without insurance.    Georgie Chard NP-C [facility] Pager: [phone]

## 2018-06-01 NOTE — Progress Notes (Signed)
CM / Transition of Care Team following for progression of care; please see full CM note from 05/28/2018; Abelino Derrick [facility] [phone]

## 2018-06-01 NOTE — Progress Notes (Signed)
PROGRESS NOTE    Bruce Morse  NMM:[id number] DOB: Oct 09, 1990 DOA: 05/27/2018 PCP: Patient, No Pcp Per    Brief Narrative:  HPI per Dr. Lorette Ang is a 28 y.o. 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  The same time patient has been also having shortness of breath on exertion and orthopnea for the same duration of time.  Denies any chest pain productive cough fever chills.  Denies any nausea vomiting or diarrhea.  Abdominal pain is not related to food or defecation.  No weight loss or any blood in the stools.  Patient has not noticed any swelling in the legs.  Patient states that he frequently wakes up in the sleep from shortness of breath last few days.  ED Course: In the ER CT scan of the abdomen and pelvis done shows features concerning for acute pulmonary edema this was confirmed with chest x-ray shows cardiomegaly..  Lipase LFTs were largely normal.  Creatinine 1.4.  Slightly elevated WBC.  UA shows mild proteinuria.  Patient was found to be tachycardic.  An EKG shows sinus tachycardia.  Lasix 60 mg IV was given.  Patient admitted for acute CHF unknown EF.   Assessment & Plan:   Principal Problem:   Acute systolic CHF (congestive heart failure) (HCC) Active Problems:   Left lower quadrant abdominal pain   Acute pulmonary edema (HCC)   Hypoxemia   Alcoholic cardiomyopathy (HCC)   AKI (acute kidney injury) (HCC)   Congenital heart disease   NSVT (nonsustained ventricular tachycardia) (HCC)   Alcohol use  1 acute systolic heart failure/cardiomyopathy Patient with history of congenital heart disease not clearly stated which was diagnosed when he was in [place] the ED with shortness of breath on exertion and orthopnea of unknown duration.  CT abdomen and pelvis which was done showing  features concerning for pulmonary edema which was confirmed by chest x-ray which showed cardiomegaly.  Patient placed on IV Lasix with good diuresis.  Patient with a urine output of 1.7 L over the past 24 hours.  Patient is negative for 6.050 L during this hospitalization. Current weight is 75.3 kg from 75.1 kg from 74.5 kg from 75.5 kg from 79.3 kg ( 05/28/2018).  2D echo done with a severely reduced systolic function of 20 to 25%, moderately dilated left ventricle, moderate mitral valvular regurgitation, moderate tricuspid valvular regurgitation.  Patient with clinical improvement.  Patient with history of chronic alcohol use and concern for alcoholic cardiomyopathy.  Patient seen in consultation by cardiology and patient was on IV diuretics which have been discontinued.  Patient placed on spironolactone digoxin and low-dose Entresto per cardiology.  Diuretics on hold.  Patient status post left and right heart catheterization 05/31/2018 which showed compensated nonischemic cardiomyopathy with normal coronaries and a moderately reduced cardiac output.  Per cardiology.  2.  Congenital heart disease Records pending and have been requested by cardiology.  Patient noted to have had congenital heart disease diagnosed at age 55 after a syncopal episode while playing basketball.  Patient s/p right and left heart catheterization, 05/31/2018 which showed compensated nonischemic cardiomyopathy with normal coronaries and moderately reduced cardiac output.  Per cardiology.  3.  Nonsustained V. tach on telemetry Patient currently asymptomatic.  Per cardiology patient will need a LifeVest prior to discharge.  Keep magnesium greater than  2.  Keep potassium greater than 4.  LifeVest to be arranged per cardiology for patient.  Continue to hold on beta-blocker due to severe cardiomyopathy and wide pulse pressure noted on cardiac catheterization.  Per cardiology.  4.  Alcohol abuse Complete alcohol cessation stressed to patient.   Patient with no signs of withdrawal.  Continue thiamine,  Folic acid, multivitamin, PPI.  Discontinued the Ativan withdrawal.  5.  Tobacco or marijuana use Complete cessation stressed to patient.  6.  Abdominal pain No clear etiology.  May be secondary to volume overload versus gastritis..  CT abdomen and pelvis unremarkable.  Improved clinically.  Continue PPI.   7.  Acute kidney injury Slight bump in creatinine likely may be secondary to diuresis.  IV Lasix has been discontinued.  Urinalysis with 30 of protein.  Creatinine seems to be trending down and improving daily.  Creatinine currently at 1.32.  Outpatient follow-up.    DVT prophylaxis: Lovenox Code Status: Full Family Communication: Updated patient.  No family at bedside. Disposition Plan: Likely home tomorrow once LifeVest has been arranged and fitted and when okay with cardiology.     Consultants:   Cardiology: Dr. Antoine Poche 05/28/2018  Procedures:  2D echo 05/28/2018  Chest x-ray 05/27/2018  CT abdomen and pelvis 05/27/2018  Right and left heart cardiac catheterization: Per Dr. Gala Romney 05/31/2018  Antimicrobials:  None   Subjective: Patient laying in bed easily arousable.  Denies any chest pain or shortness of breath.  Denies any dizziness or lightheadedness.  No palpitations.  No abdominal pain.  Patient wants to go home today.  Patient upset that he has to stay another day while LifeVest has been arranged.   Objective: Vitals:   06/01/18 0024 06/01/18 0447 06/01/18 0447 06/01/18 0928  BP: 92/72  101/68 109/74  Pulse: (!) 106  (!) 101 (!) 116  Resp: 20  17   Temp: 97.9 F (36.6 C)  98.3 F (36.8 C)   TempSrc: Oral  Oral   SpO2: 95%  100% 100%  Weight:  75.3 kg    Height:        Intake/Output Summary (Last 24 hours) at 06/01/2018 1241 Last data filed at 06/01/2018 0933 Gross per 24 hour  Intake 1244.95 ml  Output 1750 ml  Net -505.05 ml   Filed Weights   05/30/18 0529 05/31/18 0722 06/01/18 0447    Weight: 74.5 kg 75.1 kg 75.3 kg    Examination:  General exam: NAD Respiratory system: Lungs clear to auscultation bilaterally.  No wheezes, no crackles, no rhonchi.  Normal respiratory effort.  Cardiovascular system: Regular rate rhythm no murmurs rubs or gallops.  No JVD.  No lower extremity edema. Gastrointestinal system: Abdomen is soft, nontender, nondistended, positive bowel sounds.  No rebound.  No guarding.  Central nervous system: Alert and oriented. No focal neurological deficits. Extremities: Symmetric 5 x 5 power. Skin: No rashes, lesions or ulcers Psychiatry: Judgement and insight appear normal. Mood & affect appropriate.     Data Reviewed: I have personally reviewed following labs and imaging studies  CBC: Recent Labs  Lab 05/28/18 0102 05/29/18 0417 05/30/18 0605 05/31/18 0438 05/31/18 1201 05/31/18 1205 05/31/18 1206 06/01/18 0452  WBC 9.1 7.6 7.1 7.0  --   --   --  8.6  NEUTROABS  --   --   --  3.6  --   --   --   --   HGB 14.9 15.2 15.2 16.3 16.3 13.3 16.7 17.1*  HCT 46.1 45.7 44.2  49.4 48.0 39.0 49.0 52.1*  MCV 88.7 86.7 86.5 87.3  --   --   --  87.9  PLT 205 226 236 252  --   --   --  274   Basic Metabolic Panel: Recent Labs  Lab 05/28/18 0102 05/29/18 0417 05/30/18 0605 05/31/18 0438 05/31/18 1201 05/31/18 1205 05/31/18 1206 06/01/18 0452  NA 135 137 137 135 141 126* 129* 135  K 4.4 3.6 4.0 4.2 4.1 2.3* 3.9 4.5  CL 107 100 105 104  --   --   --  102  CO2 18* 25 23 23   --   --   --  22  GLUCOSE 126* 93 91 101*  --   --   --  92  BUN 15 15 17 17   --   --   --  15  CREATININE 1.45* 1.65* 1.46* 1.48*  --   --   --  1.32*  CALCIUM 8.9 9.0 8.9 8.8*  --   --   --  9.2  MG 2.0 2.1 2.1 2.1  --   --   --   --    GFR: Estimated Creatinine Clearance: 79.9 mL/min (A) (by C-G formula based on SCr of 1.32 mg/dL (H)). Liver Function Tests: Recent Labs  Lab 05/27/18 2017 05/29/18 0417 05/30/18 0605  AST 29 30 26   ALT 53* 48* 45*  ALKPHOS 52 50  47  BILITOT 0.9 1.2 1.0  PROT 6.0* 6.2* 6.1*  ALBUMIN 3.4* 3.3* 3.2*   Recent Labs  Lab 05/27/18 2017  LIPASE 34   No results for input(s): AMMONIA in the last 168 hours. Coagulation Profile: Recent Labs  Lab 05/31/18 0438  INR 1.2   Cardiac Enzymes: Recent Labs  Lab 05/28/18 0102 05/28/18 0712 05/28/18 1309  TROPONINI <0.03 <0.03 <0.03   BNP (last 3 results) No results for input(s): PROBNP in the last 8760 hours. HbA1C: No results for input(s): HGBA1C in the last 72 hours. CBG: No results for input(s): GLUCAP in the last 168 hours. Lipid Profile: No results for input(s): CHOL, HDL, LDLCALC, TRIG, CHOLHDL, LDLDIRECT in the last 72 hours. Thyroid Function Tests: No results for input(s): TSH, T4TOTAL, FREET4, T3FREE, THYROIDAB in the last 72 hours. Anemia Panel: No results for input(s): VITAMINB12, FOLATE, FERRITIN, TIBC, IRON, RETICCTPCT in the last 72 hours. Sepsis Labs: No results for input(s): PROCALCITON, LATICACIDVEN in the last 168 hours.  No results found for this or any previous visit (from the past 240 hour(s)).       Radiology Studies: No results found.      Scheduled Meds: . digoxin  0.25 mg Oral Daily  . enoxaparin (LOVENOX) injection  40 mg Subcutaneous Q24H  . folic acid  1 mg Oral Daily  . multivitamin with minerals  1 tablet Oral Daily  . pantoprazole  40 mg Oral Q0600  . sacubitril-valsartan  1 tablet Oral BID  . sodium chloride flush  3 mL Intravenous Q12H  . spironolactone  12.5 mg Oral Daily  . thiamine  100 mg Oral Daily   Or  . thiamine  100 mg Intravenous Daily   Continuous Infusions: . sodium chloride       LOS: 4 days    Time spent: 35 minutes    Ramiro Harvest, MD [facility]  If 7PM-7AM, please contact night-coverage [email or web address] Password [facility] 06/01/2018, 12:41 PM

## 2018-06-01 NOTE — Progress Notes (Signed)
CM talked to patient at the bedside for the need of LifeVest; pt is very anxious and want to go home now; Emotional support given; CM informed him that we are trying to get his vest approved through [facility] ( no medical insurance); once it is approved, he will be fitted for the vest. Rosanne Ashing with [employer] is working on the case; Alexis Goodell [phone]

## 2018-06-01 NOTE — Progress Notes (Signed)
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  No complaints, wants to go home.  Inpatient Medications    Scheduled Meds: . digoxin  0.25 mg Oral Daily  . enoxaparin (LOVENOX) injection  40 mg Subcutaneous Q24H  . folic acid  1 mg Oral Daily  . multivitamin with minerals  1 tablet Oral Daily  . pantoprazole  40 mg Oral Q0600  . sacubitril-valsartan  1 tablet Oral BID  . sodium chloride flush  3 mL Intravenous Q12H  . spironolactone  12.5 mg Oral Daily  . thiamine  100 mg Oral Daily   Or  . thiamine  100 mg Intravenous Daily   Continuous Infusions: . sodium chloride     PRN Meds: sodium chloride, acetaminophen, ondansetron (ZOFRAN) IV, ondansetron **OR** [DISCONTINUED] ondansetron (ZOFRAN) IV, sodium chloride flush   Vital Signs    Vitals:   06/01/18 0024 06/01/18 0447 06/01/18 0447 06/01/18 0928  BP: 92/72  101/68 109/74  Pulse: (!) 106  (!) 101 (!) 116  Resp: 20  17   Temp: 97.9 F (36.6 C)  98.3 F (36.8 C)   TempSrc: Oral  Oral   SpO2: 95%  100% 100%  Weight:  75.3 kg    Height:        Intake/Output Summary (Last 24 hours) at 06/01/2018 1001 Last data filed at 06/01/2018 0933 Gross per 24 hour  Intake 1364.95 ml  Output 1900 ml  Net -535.05 ml   Last 3 Weights 06/01/2018 05/31/2018 05/30/2018  Weight (lbs) 165 lb 14.4 oz 165 lb 8 oz 164 lb 4.8 oz  Weight (kg) 75.252 kg 75.07 kg 74.526 kg      Telemetry    Sinus with occasional PVC's - Personally Reviewed  ECG    N/A  Physical Exam   General appearance: alert, no distress and no complaints Neck: no carotid bruit, no JVD and thyroid not enlarged, symmetric, no tenderness/mass/nodules Lungs: diminished breath sounds bibasilar Heart: regular rate and rhythm Abdomen: soft, non-tender;  bowel sounds normal; no masses,  no organomegaly Extremities: extremities normal, atraumatic, no cyanosis or edema Pulses: 2+ and symmetric Skin: Skin color, texture, turgor normal. No rashes or lesions Neurologic: Grossly normal Psych: Pleasant   Labs    Chemistry Recent Labs  Lab 05/27/18 2017  05/29/18 0417 05/30/18 0605 05/31/18 0438  05/31/18 1205 05/31/18 1206 06/01/18 0452  NA 136   < > 137 137 135   < > 126* 129* 135  K 4.5   < > 3.6 4.0 4.2   < > 2.3* 3.9 4.5  CL 105   < > 100 105 104  --   --   --  102  CO2 21*   < > 25 23 23   --   --   --  22  GLUCOSE 113*   < > 93 91 101*  --   --   --  92  BUN 14   < > 15 17 17   --   --   --  15  CREATININE 1.46*   < > 1.65* 1.46* 1.48*  --   --   --  1.32*  CALCIUM 8.8*   < > 9.0 8.9 8.8*  --   --   --  9.2  PROT 6.0*  --  6.2* 6.1*  --   --   --   --   --   ALBUMIN 3.4*  --  3.3* 3.2*  --   --   --   --   --   AST 29  --  30 26  --   --   --   --   --   ALT 53*  --  48* 45*  --   --   --   --   --   ALKPHOS 52  --  50 47  --   --   --   --   --   BILITOT 0.9  --  1.2 1.0  --   --   --   --   --   GFRNONAA >60   < > 55* >60 >60  --   --   --  >60  GFRAA >60   < > >60 >60 >60  --   --   --  >60  ANIONGAP 10   < > 12 9 8   --   --   --  11   < > = values in this interval not displayed.     Hematology Recent Labs  Lab 05/30/18 1859 05/31/18 0438  05/31/18 1205 05/31/18 1206 06/01/18 0452  WBC 7.1 7.0  --   --   --  8.6  RBC 5.11 5.66  --   --   --  5.93*  HGB 15.2 16.3   < > 13.3 16.7 17.1*  HCT 44.2 49.4   < > 39.0 49.0 52.1*  MCV 86.5 87.3  --   --   --  87.9  MCH 29.7 28.8  --   --   --  28.8  MCHC 34.4 33.0  --   --   --  32.8  RDW 12.5 12.3  --   --   --  12.4  PLT 236 252  --   --   --  274   < > = values in this interval not displayed.    Cardiac Enzymes Recent Labs  Lab 05/28/18 0102 05/28/18 0712 05/28/18 1309  TROPONINI <0.03 <0.03 <0.03   No results for input(s): TROPIPOC in the last 168 hours.     BNP Recent Labs  Lab 05/28/18 0102  BNP 1,232.5*     DDimer No results for input(s): DDIMER in the last 168 hours.   Radiology    No results found.  Cardiac Studies   ECHOCARDIOGRAM REPORT       Patient Name:   ZIDAN MACKOWIAK Date of Exam: 05/28/2018 Medical Rec #:  [id number]        Height:       68.0 in Accession #:    [id number]       Weight:       174.8 lb Date of Birth:  1991-03-01       BSA:          1.93 m Patient Age:    28 years         BP:           115/72 mmHg Patient Gender: M                HR:           117 bpm. Exam Location:  Inpatient    Procedure: 2D Echo  Indications:    Dyspnea 786.09 / R06.00   History:  Patient has no prior history of Echocardiogram examinations.                 CHF. Pulmonary edema.   Sonographer:    Tobin Chad Referring Phys: [id number] ARSHAD N KAKRAKANDY  IMPRESSIONS    1. The left ventricle has severely reduced systolic function, with an ejection fraction of 20-25%. The cavity size was moderately dilated. Left ventricular diastolic Doppler parameters are consistent with pseudonormalization.  2. The right ventricle has normal systolic function. The cavity was normal. There is no increase in right ventricular wall thickness.  3. Left atrial size was mildly dilated.  4. The mitral valve is degenerative. Mild thickening of the mitral valve leaflet. Mitral valve regurgitation is moderate by color flow Doppler.  5. The tricuspid valve is normal in structure. Tricuspid valve regurgitation is moderate.  6. The aortic valve is tricuspid Mild thickening of the aortic valve Aortic valve regurgitation is trivial by color flow Doppler.  7. The pulmonic valve was normal in structure.  Patient Profile     28 y.o. male with new acute systolic CHF - LVEF 20-25%, possibly etoh cardiomyopathy vs. Tachy-mediated  Assessment & Plan    1. Acute systolic heart failure Presented with 2 to 3 months history of progressive  worsening abdominal tightness/pain, orthopnea and PND.  Echocardiogram with severely LV reduced function to 20%, moderately dilated left atrium.  No significant valvular heart disease. -Chest x-ray with pulmonary edema.  BNP elevated. 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 -EKG with sinus rhythm and repolarization abnormality with LVH. -Strict I&O.  Negative 1.5L overnight- now 5.5L negative. Creatinine stalbe - BP soft, he is tolerating Entresto 26/24 mg PO BID - nsVT on telemetry, he will need a Life Vest prior to discharge - d/w Dr. Gala Romney - hold of on BB due to severe CM and wide pulse pressure - Will pursue LiveVest given NSVT and low EF (<35%) - NICM- this will likely be placed today. Noted he does not have insurance, but may have a rental option available. -R/LHC yesterday shows compensated NICM with normal coronaries and moderately reduced cardiac output - Per Dr. Gala Romney, will arrange follow-up with the [facility]  2.  Congenital heart disease -Diagnosed at age 67 after syncope episode.  Records pending - will need to get them to the CHF clinic  3.  Alcohol abuse -Advised complete cessation. On CIWA protocol.  4.  Tobacco and marijuana smoking -Advised cessation.  5. AKI -Creatinine further improved today to 1.32  For questions or updates, please contact [facility] Please consult [email or web address] for contact info under     Chrystie Nose, MD, Milagros Loll  [place]  [facility]  Medical Director of the [facility]  [facility] Diplomate of the American Board of Clinical Lipidology Attending Cardiologist  Direct Dial: [phone]  Fax: [phone]  Website:  [email or web address]  Chrystie Nose, MD  06/01/2018, 10:01 AM

## 2018-06-02 DIAGNOSIS — I5021 Acute systolic (congestive) heart failure: Secondary | ICD-10-CM

## 2018-06-02 DIAGNOSIS — R0902 Hypoxemia: Secondary | ICD-10-CM

## 2018-06-02 DIAGNOSIS — J810 Acute pulmonary edema: Secondary | ICD-10-CM

## 2018-06-02 LAB — BASIC METABOLIC PANEL
Anion gap: 8 (ref 5–15)
BUN: 16 mg/dL (ref 6–20)
CO2: 22 mmol/L (ref 22–32)
Calcium: 9.2 mg/dL (ref 8.9–10.3)
Chloride: 106 mmol/L (ref 98–111)
Creatinine, Ser: 1.4 mg/dL — ABNORMAL HIGH (ref 0.61–1.24)
GFR calc Af Amer: >60 mL/min (ref >60)
Glucose, Bld: 89 mg/dL (ref 70–99)
Potassium: 4.3 mmol/L (ref 3.5–5.1)
Sodium: 136 mmol/L (ref 135–145)

## 2018-06-02 LAB — HEMOGLOBIN AND HEMATOCRIT, BLOOD
HCT: 47.7 % (ref 39.0–52.0)
Hemoglobin: 16 g/dL (ref 13.0–17.0)

## 2018-06-02 MED ORDER — SACUBITRIL-VALSARTAN 24-26 MG PO TABS: 1.0000 | ORAL_TABLET | Freq: Two times a day (BID) | ORAL | 0 refills | Status: DC

## 2018-06-02 MED ORDER — SPIRONOLACTONE 25 MG PO TABS: 12.5000 mg | ORAL_TABLET | Freq: Every day | ORAL | 0 refills | Status: DC

## 2018-06-02 MED ORDER — FUROSEMIDE 20 MG PO TABS: 20.0000 mg | ORAL_TABLET | Freq: Every day | ORAL | 0 refills | Status: DC | PRN

## 2018-06-02 MED ORDER — DIGOXIN 250 MCG PO TABS: 0.2500 mg | ORAL_TABLET | Freq: Every day | ORAL | 0 refills | Status: DC

## 2018-06-02 MED FILL — ENTRESTO 24 MG-26 MG TABLET: 24-26 | 30 days supply | Qty: 60 | Fill #0

## 2018-06-02 MED FILL — DIGOXIN 0.125 MG TABLET: 125 | 30 days supply | Qty: 60 | Fill #0

## 2018-06-02 MED FILL — SPIRONOLACTONE 25 MG TABLET: 25 | 30 days supply | Qty: 15 | Fill #0

## 2018-06-02 MED FILL — FUROSEMIDE 20 MG TAB: 20 | 30 days supply | Qty: 30 | Fill #0

## 2018-06-02 NOTE — Plan of Care (Signed)
  Problem: Education: Goal: Knowledge of General Education information will improve Description Including pain rating scale, medication(s)/side effects and non-pharmacologic comfort measures Outcome: Progressing   Problem: Health Behavior/Discharge Planning: Goal: Ability to manage health-related needs will improve Outcome: Progressing   Problem: Clinical Measurements: Goal: Ability to maintain clinical measurements within normal limits will improve Outcome: Progressing Goal: Will remain free from infection Outcome: Progressing Goal: Diagnostic test results will improve Outcome: Progressing Goal: Respiratory complications will improve Outcome: Progressing Goal: Cardiovascular complication will be avoided Outcome: Progressing   Problem: Nutrition: Goal: Adequate nutrition will be maintained Outcome: Progressing   Problem: Coping: Goal: Level of anxiety will decrease Outcome: Progressing   Problem: Elimination: Goal: Will not experience complications related to bowel motility Outcome: Progressing Goal: Will not experience complications related to urinary retention Outcome: Progressing   Problem: Pain Managment: Goal: General experience of comfort will improve Outcome: Progressing   Problem: Safety: Goal: Ability to remain free from injury will improve Outcome: Progressing   Problem: Skin Integrity: Goal: Risk for impaired skin integrity will decrease Outcome: Progressing   Problem: Education: Goal: Ability to demonstrate management of disease process will improve Outcome: Progressing Goal: Ability to verbalize understanding of medication therapies will improve Outcome: Progressing Goal: Individualized Educational Video(s) Outcome: Progressing   Problem: Activity: Goal: Capacity to carry out activities will improve Outcome: Progressing   Problem: Cardiac: Goal: Ability to achieve and maintain adequate cardiopulmonary perfusion will improve Outcome:  Progressing   Problem: Education: Goal: Understanding of CV disease, CV risk reduction, and recovery process will improve Outcome: Progressing Goal: Individualized Educational Video(s) Outcome: Progressing   Problem: Activity: Goal: Ability to return to baseline activity level will improve Outcome: Progressing   Problem: Cardiovascular: Goal: Ability to achieve and maintain adequate cardiovascular perfusion will improve Outcome: Progressing Goal: Vascular access site(s) Level 0-1 will be maintained Outcome: Progressing   Problem: Health Behavior/Discharge Planning: Goal: Ability to safely manage health-related needs after discharge will improve Outcome: Progressing   

## 2018-06-02 NOTE — Discharge Summary (Signed)
Physician Discharge Summary  Bruce Morse UJW:[id number] DOB: [birth year] DOA: 05/27/2018  PCP: Patient, No Pcp Per  Admit date: 05/27/2018 Discharge date: 06/02/2018  Admitted From: Home  Disposition:  Home   Recommendations for Outpatient Follow-up and new medication changes:  1. Follow up with the hear failure clinic. 2. Patient has been placed on heart failure management with digoxin, sacubitril-valsartan and spironolactone.  3. No B blockade for now due to severe cardiomyopathy and wide pulse pressure.  4. LifeVest has bee arranged.  5. Take as needed furosemide.    Home Health: no  Equipment/Devices: LifeVest  Discharge Condition: stable  CODE STATUS: full  Diet recommendation: heart healthy.   Brief/Interim Summary: 28 year old male who presented with abdominal pain.  He does have significant past medical history of congenital heart disease.  He reported 4 days of left upper and left lower quadrant abdominal pain, associated with dyspnea on exertion, lower extremity edema and orthopnea.  On his initial physical examination his blood pressure was 101/78, heart rate 130, respiratory rate 20, temperature 97.5 F, oxygen saturation 98%, no JVD, his lungs had bilateral air entry with no rhonchi or rales, heart S1-S2 present and rhythmic, abdomen soft nontender, no significant lower extremity edema.  Sodium 135, potassium 4.4, chloride 107, bicarb 18, glucose 126, BUN 15, creatinine 1.45, BNP 1232, troponin 0.03, white count 9.1, hemoglobin 14.9, hematocrit 46.1, platelets 205, urinalysis, 30 protein, specific gravity 1.029.  Toxicology screen positive for opiates and tetrahydrocannabinol.  He had abdominal CT which showed groundglass opacities at the base of the lungs bilaterally, his chest x-ray showed cardiomegaly with bilateral interstitial infiltrates consistent with pulmonary edema.  His EKG had a rate of 120 bpm, sinus rhythm, normal axis, normal conduction, normal intervals, QTC  manually corrected 463, positive LVH with T wave inversions in V5 and V6.  No ST segment changes.  Patient was admitted to the hospital with the working diagnosis of acute decompensation of heart failure.  1.  Acute systolic heart failure.  Left ventricle ejection fraction 20 to 25% with moderately dilated cavity.  Patient was admitted to a telemetry unit, he was aggressively diuresed with IV furosemide, negative fluid balance was achieved (-6,307 ml since admission), with significant improvement of his symptoms.  Patient underwent echocardiography which showed ejection fraction 20 to 25% on the left ventricle, the cavity was moderately dilated, no increase in left ventricle wall thickness.  No significant valvular disease.  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  Cardiac catheterization showed normal coronaries, severe nonischemic cardiomyopathy fraction 15%, with well compensated hemodynamics with moderate to severe reduced cardiac output.  Patient was placed on heart failure regimen including digoxin, Entresto and spironolactone.  Patient will take furosemide as needed for fluid retention.  2.  Congenital heart disease.  Records still pending, apparently he was diagnosed with congenital heart disease at the age of 40 after syncope episode while playing basketball.  3.  Nonsustained ventricular tachycardia.  Ventricular arrhythmia likely related to severe cardiomyopathy.  Will continue medical therapy for heart failure, LifeVest was arranged.  No beta-blockade for now due to wide pulse pressure on cardiac catheterization.   4.  Alcohol, tobacco and tetrahydrocannabinol use/ abuse.  No signs of withdrawal, patient was advised about cessation.  5.  Acute kidney injury.  Patient underwent aggressive diuresis, the peak creatinine was 1.65, at discharge is down to 1.40, potassium 4.3, continue furosemide only as needed.  Discharge Diagnoses:  Principal Problem:   Acute systolic CHF (congestive heart  failure) (HCC) Active Problems:  Left lower quadrant abdominal pain   Acute pulmonary edema (HCC)   Hypoxemia   Alcoholic cardiomyopathy (HCC)   AKI (acute kidney injury) (HCC)   Congenital heart disease   NSVT (nonsustained ventricular tachycardia) (HCC)   Alcohol use    Discharge Instructions   Allergies as of 06/02/2018   No Known Allergies     Medication List    TAKE these medications   digoxin 0.25 MG tablet Commonly known as:  LANOXIN Take 1 tablet (0.25 mg total) by mouth daily for 30 days. Start taking on:  June 03, 2018   furosemide 20 MG tablet Commonly known as:  Lasix Take 1 tablet (20 mg total) by mouth daily as needed for up to 30 days for fluid or edema (shortness of breath or leg swelling.).   sacubitril-valsartan 24-26 MG Commonly known as:  ENTRESTO Take 1 tablet by mouth 2 (two) times daily for 30 days.   spironolactone 25 MG tablet Commonly known as:  ALDACTONE Take 0.5 tablets (12.5 mg total) by mouth daily for 30 days. Start taking on:  June 03, 2018      Follow-up Information    Graciella Freer, PA-C Follow up.   Specialty:  Physician Assistant Why:  06/10/2018 at 1:30 pm   [facility] [address] [place] [address]    Contact information: [address] [place] [address] [phone]          No Known Allergies  Consultations:  Cardiology    Procedures/Studies: Dg Chest 2 View  Result Date: 05/27/2018 CLINICAL DATA:  Stomach pain EXAM: CHEST - 2 VIEW COMPARISON:  CT 05/27/2018 FINDINGS: Cardiomegaly with vascular congestion and bilateral interstitial and ground-glass opacity consistent with edema. No significant pleural effusion. No pneumothorax. IMPRESSION: Cardiomegaly with vascular congestion and diffuse bilateral interstitial and ground-glass opacity consistent with pulmonary edema Electronically Signed   By: Jasmine Pang M.D.   On: 05/27/2018 23:14    Ct Abdomen Pelvis W Contrast  Result Date: 05/27/2018 CLINICAL DATA:  Abdominal pain. EXAM: CT ABDOMEN AND PELVIS WITH CONTRAST TECHNIQUE: Multidetector CT imaging of the abdomen and pelvis was performed using the standard protocol following bolus administration of intravenous contrast. CONTRAST:  OMNIPAQUE IOHEXOL 300 MG/ML  SOLN COMPARISON:  None. FINDINGS: Lower chest: Geographic ground-glass opacities in the bases. Mild smooth septal thickening. Heart is prominent size for age. Hepatobiliary: No focal hepatic lesion, however examination is performed on arterial phase imaging which limits detailed parenchymal assessment. Gallbladder partially distended, no calcified stone. No biliary dilatation. Pancreas: No ductal dilatation or inflammation. Spleen: Normal in size without focal abnormality. Adrenals/Urinary Tract: Normal adrenal glands. No hydronephrosis or perinephric edema. Homogeneous renal enhancement. Urinary bladder is nondistended and well evaluated. Stomach/Bowel: Bowel evaluation is limited in the absence of enteric contrast and paucity of intra-abdominal fat. Stomach is nondistended. Small amount of high-density ingested material in the stomach and duodenum. No small bowel dilatation, inflammation, or obstruction. Normal appendix, for example images 58-61 series 3. Moderate stool in the proximal colon the remainder the colon is decompressed which limits assessment, no obvious colonic wall thickening. No pericolonic inflammation. No visualize colonic diverticula. Vascular/Lymphatic: Imaging obtained in arterial phase. Abdominal aorta is normal in caliber. No adenopathy. Reproductive: Prostate is unremarkable. Other: No free air or free fluid. Musculoskeletal: There are no acute or suspicious osseous abnormalities. IMPRESSION: 1. No acute abnormality in the abdomen/pelvis. 2. Geographic ground-glass opacities in the lung bases with smooth  septal thickening. Findings suggest pulmonary edema.  Recommend correlation with chest radiograph. Electronically Signed   By: Narda Rutherford M.D.   On: 05/27/2018 22:22       Subjective: Patient is feeling better, no dyspnea, chest pain or lower extremity edema.   Discharge Exam: Vitals:   06/02/18 0810 06/02/18 0814  BP:  102/80  Pulse: (!) 116 (!) 116  Resp:    Temp:    SpO2:  100%   Vitals:   06/01/18 2337 06/02/18 0400 06/02/18 0810 06/02/18 0814  BP: (!) 110/57 100/73  102/80  Pulse: (!) 101 (!) 104 (!) 116 (!) 116  Resp:  16    Temp:  98.2 F (36.8 C)    TempSrc:  Oral    SpO2:  100%  100%  Weight:  74.6 kg    Height:        General: Not in pain or dyspnea Neurology: Awake and alert, non focal  E ENT: no pallor, no icterus, oral mucosa moist Cardiovascular: No JVD. S1-S2 present, rhythmic, no gallops, rubs, or murmurs. No lower extremity edema. Pulmonary: positive breath sounds bilaterally, adequate air movement, no wheezing, rhonchi or rales. Gastrointestinal. Abdomen with no organomegaly, non tender, no rebound or guarding Skin. No rashes Musculoskeletal: no joint deformities   The results of significant diagnostics from this hospitalization (including imaging, microbiology, ancillary and laboratory) are listed below for reference.     Microbiology: No results found for this or any previous visit (from the past 240 hour(s)).   Labs: BNP (last 3 results) Recent Labs    05/28/18 0102  BNP 1,232.5*   Basic Metabolic Panel: Recent Labs  Lab 05/28/18 0102 05/29/18 0417 05/30/18 0511 05/31/18 0438 05/31/18 1201 05/31/18 1205 05/31/18 1206 06/01/18 0452 06/02/18 0447  NA 135 137 137 135 141 126* 129* 135 136  K 4.4 3.6 4.0 4.2 4.1 2.3* 3.9 4.5 4.3  CL 107 100 105 104  --   --   --  102 106  CO2 18* 25 23 23   --   --   --  22 22  GLUCOSE 126* 93 91 101*  --   --   --  92 89  BUN 15 15 17 17   --   --   --  15 16  CREATININE 1.45* 1.65* 1.46* 1.48*  --   --   --  1.32* 1.40*  CALCIUM 8.9 9.0 8.9  8.8*  --   --   --  9.2 9.2  MG 2.0 2.1 2.1 2.1  --   --   --   --   --    Liver Function Tests: Recent Labs  Lab 05/27/18 2017 05/29/18 0417 05/30/18 0605  AST 29 30 26   ALT 53* 48* 45*  ALKPHOS 52 50 47  BILITOT 0.9 1.2 1.0  PROT 6.0* 6.2* 6.1*  ALBUMIN 3.4* 3.3* 3.2*   Recent Labs  Lab 05/27/18 2017  LIPASE 34   No results for input(s): AMMONIA in the last 168 hours. CBC: Recent Labs  Lab 05/28/18 0102 05/29/18 0417 05/30/18 0605 05/31/18 0438 05/31/18 1201 05/31/18 1205 05/31/18 1206 06/01/18 0452 06/02/18 0447  WBC 9.1 7.6 7.1 7.0  --   --   --  8.6  --   NEUTROABS  --   --   --  3.6  --   --   --   --   --   HGB 14.9 15.2 15.2 16.3 16.3 13.3 16.7 17.1* 16.0  HCT 46.1  45.7 44.2 49.4 48.0 39.0 49.0 52.1* 47.7  MCV 88.7 86.7 86.5 87.3  --   --   --  87.9  --   PLT 205 226 236 252  --   --   --  274  --    Cardiac Enzymes: Recent Labs  Lab 05/28/18 0102 05/28/18 0712 05/28/18 1309  TROPONINI <0.03 <0.03 <0.03   BNP: Invalid input(s): POCBNP CBG: No results for input(s): GLUCAP in the last 168 hours. D-Dimer No results for input(s): DDIMER in the last 72 hours. Hgb A1c No results for input(s): HGBA1C in the last 72 hours. Lipid Profile No results for input(s): CHOL, HDL, LDLCALC, TRIG, CHOLHDL, LDLDIRECT in the last 72 hours. Thyroid function studies No results for input(s): TSH, T4TOTAL, T3FREE, THYROIDAB in the last 72 hours.  Invalid input(s): FREET3 Anemia work up No results for input(s): VITAMINB12, FOLATE, FERRITIN, TIBC, IRON, RETICCTPCT in the last 72 hours. Urinalysis    Component Value Date/Time   COLORURINE AMBER (A) 05/27/2018 2012   APPEARANCEUR CLEAR 05/27/2018 2012   LABSPEC 1.029 05/27/2018 2012   PHURINE 5.0 05/27/2018 2012   GLUCOSEU NEGATIVE 05/27/2018 2012   HGBUR NEGATIVE 05/27/2018 2012   BILIRUBINUR NEGATIVE 05/27/2018 2012   KETONESUR NEGATIVE 05/27/2018 2012   PROTEINUR 30 (A) 05/27/2018 2012   NITRITE NEGATIVE  05/27/2018 2012   LEUKOCYTESUR NEGATIVE 05/27/2018 2012   Sepsis Labs Invalid input(s): PROCALCITONIN,  WBC,  LACTICIDVEN Microbiology No results found for this or any previous visit (from the past 240 hour(s)).   Time coordinating discharge: 45 minutes  SIGNED:   Coralie Keens, MD  [facility] 06/02/2018, 9:21 AM

## 2018-06-02 NOTE — Progress Notes (Signed)
Patient was fitted for the LifeVest last night; medication will be provided through the [facility] pharmacy; patient stated that his girlfriend will be in to help pay for his medication; Alexis Goodell [phone]

## 2018-06-02 NOTE — Progress Notes (Signed)
LifeVest fitted yesterday. Case management has seen - may need assistance from [facility] pharmacy since he is not insured, as far as getting meds at reasonable cost. Apparently financial counselor referral has been made. From a cardiac standpoint, ok to d/c home today once he has meds or plan to get meds. Scheduled for follow-up in CHF clinic with Otilio Saber, PA-C on 06/10/2018 at 1:30 pm.  [facility] will sign off.   Medication Recommendations:  Continue current meds Other recommendations (labs, testing, etc):  None Follow up as an outpatient:  Otilio Saber, PA-C on 06/10/2018 at 1:30 pm  Chrystie Nose, MD, [facility], Jerrel Ivory  [place]  [facility]  Medical Director of the Advanced Lipid Disorders &  Cardiovascular Risk Reduction Clinic Diplomate of the American Board of Clinical Lipidology Attending Cardiologist  Direct Dial: [phone]  Fax: [phone]  Website:  [email or web address]

## 2018-06-10 ENCOUNTER — Encounter (HOSPITAL_COMMUNITY): Payer: Self-pay

## 2018-06-10 ENCOUNTER — Other Ambulatory Visit: Payer: Self-pay

## 2018-06-10 ENCOUNTER — Other Ambulatory Visit (HOSPITAL_COMMUNITY): Payer: Self-pay | Admitting: Cardiology

## 2018-06-10 ENCOUNTER — Ambulatory Visit (HOSPITAL_COMMUNITY)
Admit: 2018-06-10 | Discharge: 2018-06-10 | Disposition: A | Discharge status: Home / Self Care | Payer: Self-pay | Source: Ambulatory Visit | Attending: Cardiology | Admitting: Cardiology

## 2018-06-10 VITALS — BP 100/82 | HR 102 | Wt 178.6 lb

## 2018-06-10 DIAGNOSIS — N179 Acute kidney failure, unspecified: Secondary | ICD-10-CM

## 2018-06-10 DIAGNOSIS — F17210 Nicotine dependence, cigarettes, uncomplicated: Secondary | ICD-10-CM | POA: Insufficient documentation

## 2018-06-10 DIAGNOSIS — I4729 Other ventricular tachycardia: Secondary | ICD-10-CM

## 2018-06-10 DIAGNOSIS — I428 Other cardiomyopathies: Secondary | ICD-10-CM

## 2018-06-10 DIAGNOSIS — Z79899 Other long term (current) drug therapy: Secondary | ICD-10-CM | POA: Insufficient documentation

## 2018-06-10 DIAGNOSIS — I5022 Chronic systolic (congestive) heart failure: Secondary | ICD-10-CM

## 2018-06-10 DIAGNOSIS — F1010 Alcohol abuse, uncomplicated: Secondary | ICD-10-CM

## 2018-06-10 DIAGNOSIS — Q249 Congenital malformation of heart, unspecified: Secondary | ICD-10-CM | POA: Insufficient documentation

## 2018-06-10 DIAGNOSIS — I420 Dilated cardiomyopathy: Secondary | ICD-10-CM | POA: Insufficient documentation

## 2018-06-10 DIAGNOSIS — F1210 Cannabis abuse, uncomplicated: Secondary | ICD-10-CM

## 2018-06-10 DIAGNOSIS — Z720 Tobacco use: Secondary | ICD-10-CM

## 2018-06-10 DIAGNOSIS — I472 Ventricular tachycardia: Secondary | ICD-10-CM | POA: Insufficient documentation

## 2018-06-10 LAB — BASIC METABOLIC PANEL
Anion gap: 6 (ref 5–15)
BUN: 11 mg/dL (ref 6–20)
CO2: 23 mmol/L (ref 22–32)
Calcium: 9.3 mg/dL (ref 8.9–10.3)
Chloride: 108 mmol/L (ref 98–111)
Creatinine, Ser: 1.15 mg/dL (ref 0.61–1.24)
GFR calc Af Amer: >60 mL/min (ref >60)
GFR calc non Af Amer: >60 mL/min (ref >60)
Glucose, Bld: 88 mg/dL (ref 70–99)
Potassium: 4.3 mmol/L (ref 3.5–5.1)
Sodium: 137 mmol/L (ref 135–145)

## 2018-06-10 LAB — MAGNESIUM: Magnesium: 2.3 mg/dL (ref 1.7–2.4)

## 2018-06-10 MED ORDER — SACUBITRIL-VALSARTAN 24-26 MG PO TABS: 1.0000 | ORAL_TABLET | Freq: Two times a day (BID) | ORAL | 11 refills | Status: AC

## 2018-06-10 MED ORDER — IVABRADINE HCL 5 MG PO TABS: 5.0000 mg | ORAL_TABLET | Freq: Two times a day (BID) | ORAL | 11 refills | Status: DC

## 2018-06-10 MED ORDER — SACUBITRIL-VALSARTAN 24-26 MG PO TABS: 1.0000 | ORAL_TABLET | Freq: Two times a day (BID) | ORAL | 6 refills | Status: DC

## 2018-06-10 NOTE — Progress Notes (Addendum)
Advanced Heart Failure Clinic Note   Referring Physician: PCP: Patient, No Pcp Per PCP-Cardiologist: No primary care provider on file.   HPI:  Bruce Morse is a 28 y.o. male with chronic systolic CHF, NICM, "congenital heart disease", ETOH abuse, Tobacco abuse, and THC use/abuse.   Admitted 3/5 - 3/11 with abdominal pain and significant history of congenital heart disease + orthopnea and LE edema. EKG with tachycardia and LVH. Echo showed new diagnosis of CHF with EF 20-25%. Taken for cath as below which demonstrated NICM. HF meds adjusted as tolerated.   He presents today to establish in the HF clinic. Has extensive history at [facility] as below. He states there work up was for the most part negative. He doesn't ever remember getting genetic testing, and thinks that the myocardial biopsy came back with "extra tissue" on the right side of his heart, but isn't sure about any more details. He is feeling much better since recent admit. Wearing Lifevest. His appetite has much improved. He has no further abdominal pain, no lightheadedness, no dizziness, no chest pain, orthopnea, or PND. He is taking all medications as directed. He hasn't needed any extra lasix. He has not had ANY ETOH since leaving the hospital. States PTA he was drinking 2-3 40 oz beers a day. He has cut his smoking back from 1/2 ppd to about 5 cigarettes a day. Continues to smoke THC on occasion.   Echo 05/28/2018 LVEF 20-25%, Mild LAE, Mod MR, Mod TR, Mild MR.   Review of systems complete and found to be negative unless listed in HPI.    Reviewed records from [facility] as below.   Reviewed admission from 08/2003. Had near syncopal episode while playing basketball. Echo was performed which demonstrated dilated cardiomyopathy  E[facility] 09/16/2003 ([facility]) - Decreased LV function with "shortening fraction of 22%" (Normal is > 28%) TEE 09/21/2003 ([facility]) - Normal intracardiac anatomy.  Cath 09/21/2003 ([facility]) -  Normal coronaries and filling pressures. Very irritable myocardium with frequent ectopy. No intracardiac shunts. Echo 01/2004   ([facility])  - LVEF 45% Echo 05/17/2004 ([facility]) - LVEF 55% Echo 11/15/2004 ([facility]) - LVEF 55% Echo 05/22/2005 ([facility]) - LVEF 44% ETT (bicycle) - 12/29/2005 - Excellent exercise tolerance with no evidence of abnormality. Transient inversion of the T wave in stage 2 of exercise, unclear significance.  Echo 01/23/2006 ([facility]) - LVEF 45%, normal atria and septum, normal great vessels, mild EF reduction, No shunts, Normal TV, PV, MV, and AV.  - "Idiopathic dilated cardiomyopathy, mild" Echo 04/15/2007 ([facility]) - LVEF 47%  R/LHC 05/31/2018 ([facility]) Ao = 89/68 (76)  LV =  90/9 RA = 2 RV = 22/1 PA = 17/9 (14) PCW = 7 Fick cardiac output/index = 3.8/2.0 PVR = 1.9 WU SVR = 1549 Ao sat = 99% PA sat = 69%, 72%  Assessment: 1. Normal coronary arteries 2. Severe NICM EF 15% 3. Well compensated hemodynamics with moderately to severely reduced CO  Past Medical History:  Diagnosis Date  . Alcohol abuse   . Chronic systolic (congestive) heart failure (HCC)   . Congenital heart disease   . Marijuana abuse   . Syncope    at age 4  . Tobacco abuse    Current Outpatient Medications  Medication Sig Dispense Refill  . digoxin (LANOXIN) 0.25 MG tablet Take 1 tablet (0.25 mg total) by mouth daily for 30 days. 30 tablet 0  . furosemide (LASIX) 20 MG tablet Take 1 tablet (20 mg total) by mouth  daily as needed for up to 30 days for fluid or edema (shortness of breath or leg swelling.). 30 tablet 0  . sacubitril-valsartan (ENTRESTO) 24-26 MG Take 1 tablet by mouth 2 (two) times daily for 30 days. 60 tablet 0  . spironolactone (ALDACTONE) 25 MG tablet Take 0.5 tablets (12.5 mg total) by mouth daily for 30 days. 15 tablet 0   No current facility-administered medications for this encounter.    No Known Allergies   Social History   Socioeconomic History  .  Marital status: Single    Spouse name: Not on file  . Number of children: Not on file  . Years of education: Not on file  . Highest education level: Not on file  Occupational History  . Not on file  Social Needs  . Financial resource strain: Not on file  . Food insecurity:    Worry: Not on file    Inability: Not on file  . Transportation needs:    Medical: Not on file    Non-medical: Not on file  Tobacco Use  . Smoking status: Current Every Day Smoker  . Smokeless tobacco: Never Used  Substance and Sexual Activity  . Alcohol use: Yes    Comment: Daily.  . Drug use: Not on file  . Sexual activity: Not on file  Lifestyle  . Physical activity:    Days per week: Not on file    Minutes per session: Not on file  . Stress: Not on file  Relationships  . Social connections:    Talks on phone: Not on file    Gets together: Not on file    Attends religious service: Not on file    Active member of club or organization: Not on file    Attends meetings of clubs or organizations: Not on file    Relationship status: Not on file  . Intimate partner violence:    Fear of current or ex partner: Not on file    Emotionally abused: Not on file    Physically abused: Not on file    Forced sexual activity: Not on file  Other Topics Concern  . Not on file  Social History Narrative  . Not on file   Family History  Problem Relation Age of Onset  . CAD Neg Hx   . Diabetes Mellitus II Neg Hx    Paternal grandmother had an MI at age 1.  Vitals:   06/10/18 1358  BP: 100/82  Pulse: (!) 102  SpO2: 98%  Weight: 81 kg (178 lb 9.6 oz)    Wt Readings from Last 3 Encounters:  06/10/18 81 kg (178 lb 9.6 oz)  06/02/18 74.6 kg (164 lb 8 oz)   PHYSICAL EXAM: General:  Well appearing. No respiratory difficulty HEENT: normal Neck: supple. no JVD. Carotids 2+ bilat; no bruits. No lymphadenopathy or thyromegaly appreciated. Cor: PMI nondisplaced. Regular rate & rhythm. No rubs, gallops or  murmurs. Lungs: clear Abdomen: soft, nontender, nondistended. No hepatosplenomegaly. No bruits or masses. Good bowel sounds. Extremities: no cyanosis, clubbing, rash, edema Neuro: alert & oriented x 3, cranial nerves grossly intact. moves all 4 extremities w/o difficulty. Affect pleasant.  ECG: 05/28/2018 Sinus tach 121 bpm, personally reviewed.  ASSESSMENT & PLAN:  1. Chronic systolic heart failure, NICM - Normal coronaries by cath 05/31/2018 - Echo 05/28/2018 LVEF 20-25%, Mild LAE, Mod MR, Mod TR, Mild MR. - NYHA I symptoms currently - Volume status stable on exam.  - Continue lasix 20 mg daily -  Continue Entresto 24/26 mg BID. No room to increase with soft BP. (Will need patient assistance) - Continue spironolactone 12.5 mg daily - Add corlanor 5 mg BID (Will need patient assistance) - Reinforced fluid restriction to < 2 L daily, sodium restriction to less than 2000 mg daily, and the importance of daily weights. - Needs cMRI once he has insurance. Needs genetic counseling once he has insurance.   2. Congenital heart disease - Extensive work up at [facility] as above. No clear etiology - Will plan cMRI and genetic screening  3. H/o NSVT - Continue lifevest. Will need repeat Echo in 2-3 months with med titration to consider ICD if EF not improved. - BMET and Mg today.  4. ETOH abuse - Stressed importance of complete cessation with dilated cardiomyopathy. He is abstinent for now.   5. Tobacco abuse - Encouraged complete cessation.  6. THC abuse - Encouraged complete cessation.  7. AKI - Peak creatinine 1.65 down to 1.40 on discharge. BMET today.   8. Social - He has no insurance. 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 He is aware.  Discussed disease state at length. He understands he made need an ICD if his EF remains low, and that at this point, the most likely cause of his CMP is ETOH abuse. He has stopped  cold [place].  He needs continued med titration for repeat Echo this summer. Would ideally have follow up in 3-4 weeks. Will assess availability as Telehealth becomes more prevalent in the setting of limiting patient exposure to the hospital setting with COVID-19.   Graciella Freer, PA-C 06/10/18   Greater than 50% of the 25 minute visit was spent in counseling/coordination of care regarding disease state education, salt/fluid restriction, sliding scale diuretics, and medication compliance.

## 2018-06-10 NOTE — Telephone Encounter (Signed)
rx printed to accompany assistance application

## 2018-06-10 NOTE — Patient Instructions (Addendum)
START Corlanor 5 mg, one tab twice a day -we are working on a patient assistance program for this medication, it may take some time for you to start this medication.  Labs today We will only contact you if something comes back abnormal or we need to make some changes. Otherwise no news is good news!  A social worker from our department will be in contact with you to further discuss insurance and patient assistance.  Your physician recommends that you schedule a follow-up appointment in: 3-4 weeks in the CHF clinic  Do the following things EVERYDAY: 1) Weigh yourself in the morning before breakfast. Write it down and keep it in a log. 2) Take your medicines as prescribed 3) Eat low salt foods-Limit salt (sodium) to 2000 mg per day.  4) Stay as active as you can everyday 5) Limit all fluids for the day to less than 2 liters

## 2018-06-30 ENCOUNTER — Telehealth (HOSPITAL_COMMUNITY): Payer: Self-pay | Admitting: Licensed Clinical Social Worker

## 2018-06-30 NOTE — Telephone Encounter (Signed)
CSW received notification that patient has been approved to receive Entresto through the [employer]  Pt ID#: [id number] Expires: 06/25/2019  CSW called pt and informed of approval- pt has not heard from foundation yet so CSW provided pt with contact number and encouraged him to reach out regarding first shipment of medication  CSW will continue to follow and assist as needed  Burna Sis, LCSW Clinical Social Worker Advanced Heart Failure Clinic Desk#: [phone] Cell#: [phone]

## 2018-07-01 ENCOUNTER — Telehealth (HOSPITAL_COMMUNITY): Payer: Self-pay | Admitting: Licensed Clinical Social Worker

## 2018-07-01 ENCOUNTER — Ambulatory Visit (HOSPITAL_COMMUNITY)
Admission: RE | Admit: 2018-07-01 | Discharge: 2018-07-01 | Disposition: A | Discharge status: Home / Self Care | Payer: Self-pay | Source: Ambulatory Visit | Attending: Internal Medicine | Admitting: Internal Medicine

## 2018-07-01 ENCOUNTER — Other Ambulatory Visit: Payer: Self-pay

## 2018-07-01 ENCOUNTER — Encounter (HOSPITAL_COMMUNITY): Payer: Self-pay

## 2018-07-01 VITALS — Wt 173.0 lb

## 2018-07-01 DIAGNOSIS — I472 Ventricular tachycardia: Secondary | ICD-10-CM

## 2018-07-01 DIAGNOSIS — F1011 Alcohol abuse, in remission: Secondary | ICD-10-CM

## 2018-07-01 DIAGNOSIS — I428 Other cardiomyopathies: Secondary | ICD-10-CM

## 2018-07-01 DIAGNOSIS — I4729 Other ventricular tachycardia: Secondary | ICD-10-CM

## 2018-07-01 DIAGNOSIS — Z720 Tobacco use: Secondary | ICD-10-CM

## 2018-07-01 DIAGNOSIS — Q249 Congenital malformation of heart, unspecified: Secondary | ICD-10-CM

## 2018-07-01 DIAGNOSIS — I5022 Chronic systolic (congestive) heart failure: Secondary | ICD-10-CM

## 2018-07-01 MED ORDER — SPIRONOLACTONE 25 MG PO TABS: 12.5000 mg | ORAL_TABLET | Freq: Every day | ORAL | 5 refills | Status: DC

## 2018-07-01 MED FILL — SPIRONOLACTONE 25 MG TABS: 25 | 30 days supply | Qty: 15 | Fill #0

## 2018-07-01 NOTE — Progress Notes (Signed)
Called patient, discussed AVS from visit with NP.  Instructions provided, questions answered. Per request, all information sent to patient via Mychart.  Pt advised he will get a call to schedule all appointments. Staff messages sent to office staff to schedule appointments and referrals.

## 2018-07-01 NOTE — Addendum Note (Signed)
Encounter addended by: Marisa Hua, RN on: 07/01/2018 12:27 PM  Actions taken: Pharmacy for encounter modified, Order list changed, Diagnosis association updated, Clinical Note Signed

## 2018-07-01 NOTE — Progress Notes (Signed)
Heart Failure TeleHealth Note  Due to national recommendations of social distancing due to COVID 19, Audio/video telehealth visit is felt to be most appropriate for this patient at this time.  See MyChart message from today for patient consent regarding telehealth for [facility].  Date:  07/01/2018   ID:  Bruce Morse, DOB Oct 23, 1990, MRN [id number]  Location: Home  Provider location: [place] [facility] Type of Visit: Established patient   PCP:  Patient, No Pcp Per  Cardiologist:  No primary care provider on file. Primary HF: Dr Gala Romney   Chief Complaint: Heart Failure    History of Present Illness: Bruce Morse is a 28 y.o. male with a history of  chronic systolic CHF, NICM, "congenital heart disease", ETOH abuse, Tobacco abuse, and THC use/abuse. Extensive history at [facility] work up was for the most part negative. He doesn't ever remember getting genetic testing, and thinks that the myocardial biopsy came back with "extra tissue" on the right side of his heart, but isn't sure about any more details  Admitted 3/5 - 06/02/18 with abdominal pain and significant history of congenital heart disease + orthopnea and LE edema. EKG with tachycardia and LVH. Echo showed new diagnosis of CHF with EF 20-25%. Taken for cath as below which demonstrated NICM. HF meds adjusted as tolerated. He was not discharged on bb.   He  presents via Special educational needs teacher for a telehealth visit today. In March he was seen in the HF clinic with recommendations to start corlanor however he never started. He does not have insurance.   Overall feeling fine. Denies SOB/PND/Orthopnea. He continues wear Life Vest. Appetite ok. No fever or chills. Weight at home 173-174  pounds. Taking all medications. Smoking on occasions. He has not been drinking alcohol. He does not work.   He  denies symptoms worrisome for COVID 19.   Reviewed admission from 08/2003. Had near syncopal episode while  playing basketball. Echo was performed which demonstrated dilated cardiomyopathy  Echo 09/16/2003 ([facility]) - Decreased LV function with "shortening fraction of 22%" (Normal is > 28%) TEE 09/21/2003 ([facility]) - Normal intracardiac anatomy.  Cath 09/21/2003 ([facility]) - Normal coronaries and filling pressures. Very irritable myocardium with frequent ectopy. No intracardiac shunts. Echo 01/2004   ([facility])  - LVEF 45% Echo 05/17/2004 ([facility]) - LVEF 55% Echo 11/15/2004 ([facility]) - LVEF 55% Echo 05/22/2005 ([facility]) - LVEF 44% ETT (bicycle) - 12/29/2005 - Excellent exercise tolerance with no evidence of abnormality. Transient inversion of the T wave in stage 2 of exercise, unclear significance.  Echo 01/23/2006 ([facility]) - LVEF 45%, normal atria and septum, normal great vessels, mild EF reduction, No shunts, Normal TV, PV, MV, and AV.  - "Idiopathic dilated cardiomyopathy, mild" Echo 04/15/2007 ([facility]) - LVEF 47%  R/LHC 05/31/2018 ([facility]) Ao = 89/68 (76)  LV = 90/9 RA = 2 RV = 22/1 PA = 17/9 (14) PCW = 7 Fick cardiac output/index = 3.8/2.0 PVR = 1.9 WU SVR = 1549 Ao sat = 99% PA sat = 69%, 72% Assessment: 1. Normal coronary arteries 2. Severe NICM EF 15% 3. Well compensated hemodynamics with moderately to severely reduced CO  Past Medical History:  Diagnosis Date  . Alcohol abuse   . Chronic systolic (congestive) heart failure (HCC)   . Congenital heart disease   . Marijuana abuse   . Syncope    at age 64  . Tobacco abuse    Past Surgical History:  Procedure Laterality Date  .  RIGHT/LEFT HEART CATH AND CORONARY ANGIOGRAPHY N/A 05/31/2018   Procedure: RIGHT/LEFT HEART CATH AND CORONARY ANGIOGRAPHY;  Surgeon: Dolores PattyBensimhon, Daniel R, MD;  [facility];  Service: Cardiovascular;  Laterality: N/A;     Current Outpatient Medications  Medication Sig Dispense Refill  . digoxin (LANOXIN) 0.25 MG tablet Take 1 tablet (0.25 mg total) by mouth daily for  30 days. 30 tablet 0  . furosemide (LASIX) 20 MG tablet Take 1 tablet (20 mg total) by mouth daily as needed for up to 30 days for fluid or edema (shortness of breath or leg swelling.). 30 tablet 0  . ivabradine (CORLANOR) 5 MG TABS tablet Take 1 tablet (5 mg total) by mouth 2 (two) times daily with a meal. 60 tablet 11  . sacubitril-valsartan (ENTRESTO) 24-26 MG Take 1 tablet by mouth 2 (two) times daily for 30 days. 60 tablet 11  . spironolactone (ALDACTONE) 25 MG tablet Take 0.5 tablets (12.5 mg total) by mouth daily for 30 days. 15 tablet 0   No current facility-administered medications for this encounter.     Allergies:   Patient has no known allergies.   Social History:  The patient  reports that he has been smoking. He has never used smokeless tobacco. He reports current alcohol use.   Family History:  The patient's family history is not on file.   ROS:  Please see the history of present illness.   All other systems are personally reviewed and negative.   Exam: Tele Health Call; Exam is subjective  General:  Speaks in full sentences. No resp difficulty. Lungs: Normal respiratory effort with conversation.  Abdomen: Non-distended per patient report Extremities: Pt denies edema. Neuro: Alert & oriented x 3.   Recent Labs: 05/28/2018: B Natriuretic Peptide 1,232.5; TSH 3.393 05/30/2018: ALT 45 06/01/2018: Platelets 274 06/02/2018: Hemoglobin 16.0 06/10/2018: BUN 11; Creatinine, Ser 1.15; Magnesium 2.3; Potassium 4.3; Sodium 137  Personally reviewed   Wt Readings from Last 3 Encounters:  07/01/18 78.5 kg (173 lb)  06/10/18 81 kg (178 lb 9.6 oz)  06/02/18 74.6 kg (164 lb 8 oz)      ASSESSMENT AND PLAN:  1. Chronic systolic heart failure, NICM - Normal coronaries by cath 05/31/2018 on cath EF 15%.  - Echo 05/28/2018 LVEF 20-25%, Mild LAE, Mod MR, Mod TR, Mild MR.  - NYHA I. Volume status stable. Continue lasix 20 mg daily. - Continue Life Vest. I am going refer to HF Paramedicine. I  would like to for them see at least one time to check EKG.  Plan to repeat ECHO in 3 months after HF medications optimized.  - Continue Entresto 24/26 mg BID.  - Continue spironolactone 12.5 mg daily - He was unable to start corlanor due lack of insurance. He has not not been on bb. I would like to start carvedilol but will need EKG first. - Needs cMRI once he has insurance. Needs genetic counseling once he has insurance.   2. Congenital heart disease - [facility] work up at [facility] as above. No clear etiology - Will plan cMRI and genetic screening once insurance is in place.   3. H/o NSVT - Continue lifevest. Will need repeat Echo in 2-3 months with med titration to consider ICD if EF not improved.  4. ETOH abuse - He has stopped drinking. I congratulated.   5. Tobacco abuse He continues to cut back. Encouraged to stop drinking all together.k     COVID screen The patient does not have  any symptoms that suggest any further testing/ screening at this time.  Social distancing reinforced today.  Relevant cardiac medications were reviewed at length with the patient today.   The patient does not have concerns regarding their medications at this time.   The following changes were made today: Today we will refill spironolactone as above.   Recommended follow-up:  Refer to HF Paramedicine for virtual visit tomorrow or next week. Follow up with Dr Gala Romney in 3 months with an ECHO.   Today, I have spent 22 minutes with the patient with telehealth technology discussing the above issues . I personally contacted Bruce Morse with HF Paramedicine to discuss referral.    Signed, Tonye Becket, NP  07/01/2018 9:32 AM  [facility] [address] [facility] [place] [address] [phone] (office) [phone] (fax)

## 2018-07-01 NOTE — Patient Instructions (Addendum)
Refill for Spironolactone 12.5mg  (1/2 tab) daily have been sent to [facility] as [facility] is currently closed.  [facility] Address: [address] [place], [place], [place] [address]  You have been referred to Paramedicine, you will be called to schedule a day for them to come to your home.   You have been scheduled for Monday, April 13th at 11am for phone visit with Nurse Practitioner.   Your physician has requested that you have an echocardiogram. Echocardiography is a painless test that uses sound waves to create images of your heart. It provides your doctor with information about the size and shape of your heart and how well your heart's chambers and valves are working. This procedure takes approximately one hour. There are no restrictions for this procedure.  Your physician recommends that you schedule a follow-up appointment in: 3 months with an ECHO with Dr. Gala Romney. You will be called to schedule this appointment

## 2018-07-01 NOTE — Telephone Encounter (Signed)
CSW informed that pt to be referred to paramedicine program and APP requesting that pt have telemedicine visit set up for Monday in presence of paramedic.  CSW spoke with community paramedic Katie who is the only paramedic who works on Mondays- she confirms she can take a new patient- CSW coordinated paramedic schedule with clinic and pt scheduled to be seen at 1:30pm on Monday alongside paramedic.  CSW will continue to follow and assist as needed  Burna Sis, LCSW Clinical Social Worker [facility] Desk#: [phone] Cell#: [phone]

## 2018-07-05 ENCOUNTER — Other Ambulatory Visit: Payer: Self-pay

## 2018-07-05 ENCOUNTER — Telehealth (HOSPITAL_COMMUNITY): Payer: Self-pay | Admitting: Licensed Clinical Social Worker

## 2018-07-05 ENCOUNTER — Other Ambulatory Visit (HOSPITAL_COMMUNITY): Payer: Self-pay

## 2018-07-05 ENCOUNTER — Encounter (HOSPITAL_COMMUNITY): Payer: Self-pay

## 2018-07-05 ENCOUNTER — Ambulatory Visit (HOSPITAL_COMMUNITY)
Admission: RE | Admit: 2018-07-05 | Discharge: 2018-07-05 | Disposition: A | Discharge status: Home / Self Care | Payer: Self-pay | Source: Ambulatory Visit | Attending: Cardiology | Admitting: Cardiology

## 2018-07-05 VITALS — BP 124/88 | HR 90 | Wt 174.0 lb

## 2018-07-05 DIAGNOSIS — I472 Ventricular tachycardia: Secondary | ICD-10-CM

## 2018-07-05 DIAGNOSIS — Z720 Tobacco use: Secondary | ICD-10-CM

## 2018-07-05 DIAGNOSIS — Z8679 Personal history of other diseases of the circulatory system: Secondary | ICD-10-CM

## 2018-07-05 DIAGNOSIS — Q249 Congenital malformation of heart, unspecified: Secondary | ICD-10-CM

## 2018-07-05 DIAGNOSIS — I428 Other cardiomyopathies: Secondary | ICD-10-CM

## 2018-07-05 DIAGNOSIS — I5022 Chronic systolic (congestive) heart failure: Secondary | ICD-10-CM

## 2018-07-05 DIAGNOSIS — F1011 Alcohol abuse, in remission: Secondary | ICD-10-CM

## 2018-07-05 DIAGNOSIS — I4729 Other ventricular tachycardia: Secondary | ICD-10-CM

## 2018-07-05 MED ORDER — FUROSEMIDE 20 MG PO TABS: 20.0000 mg | ORAL_TABLET | Freq: Every day | ORAL | 5 refills | Status: DC | PRN

## 2018-07-05 MED ORDER — DIGOXIN 250 MCG PO TABS: 0.2500 mg | ORAL_TABLET | Freq: Every day | ORAL | 5 refills | Status: DC

## 2018-07-05 MED FILL — DIGOXIN 250 MCG TABLET: 250 | 34 days supply | Qty: 34 | Fill #0

## 2018-07-05 MED FILL — FUROSEMIDE 20 MG TABS: 20 | 34 days supply | Qty: 100 | Fill #0

## 2018-07-05 NOTE — Progress Notes (Signed)
Paramedicine Encounter    Patient ID: Bruce Morse, male    DOB: [birth year], 28 y.o.   MRN: [id number]   Patient Care Team: Patient, No Pcp Per as PCP - General (General Practice)  Patient Active Problem List   Diagnosis Date Noted  . Chronic systolic congestive heart failure (HCC) 06/10/2018  . NICM (nonischemic cardiomyopathy) (HCC) 06/10/2018  . ETOH abuse 06/10/2018  . Tobacco abuse 06/10/2018  . Mild tetrahydrocannabinol (THC) abuse 06/10/2018  . AKI (acute kidney injury) (HCC) 05/29/2018  . Congenital heart disease 05/29/2018  . NSVT (nonsustained ventricular tachycardia) (HCC) 05/29/2018  . Hypoxemia   . Alcoholic cardiomyopathy (HCC)   . Alcohol use   . Acute systolic CHF (congestive heart failure) (HCC) 05/28/2018  . Left lower quadrant abdominal pain 05/28/2018  . Acute pulmonary edema (HCC) 05/28/2018    Current Outpatient Medications:  .  sacubitril-valsartan (ENTRESTO) 24-26 MG, Take 1 tablet by mouth 2 (two) times daily for 30 days., Disp: 60 tablet, Rfl: 11 .  spironolactone (ALDACTONE) 25 MG tablet, Take 0.5 tablets (12.5 mg total) by mouth daily for 30 days., Disp: 15 tablet, Rfl: 5 .  digoxin (LANOXIN) 0.25 MG tablet, Take 1 tablet (0.25 mg total) by mouth daily for 30 days., Disp: 30 tablet, Rfl: 0 .  furosemide (LASIX) 20 MG tablet, Take 1 tablet (20 mg total) by mouth daily as needed for up to 30 days for fluid or edema (shortness of breath or leg swelling.)., Disp: 30 tablet, Rfl: 0 .  ivabradine (CORLANOR) 5 MG TABS tablet, Take 1 tablet (5 mg total) by mouth 2 (two) times daily with a meal. (Patient not taking: Reported on 07/01/2018), Disp: 60 tablet, Rfl: 11 No Known Allergies    Social History   Socioeconomic History  . Marital status: Single    Spouse name: Not on file  . Number of children: Not on file  . Years of education: Not on file  . Highest education level: Not on file  Occupational History  . Not on file  Social Needs  . Financial  resource strain: Not on file  . Food insecurity:    Worry: Not on file    Inability: Not on file  . Transportation needs:    Medical: Not on file    Non-medical: Not on file  Tobacco Use  . Smoking status: Current Every Day Smoker  . Smokeless tobacco: Never Used  Substance and Sexual Activity  . Alcohol use: Yes    Comment: Daily.  . Drug use: Not on file  . Sexual activity: Not on file  Lifestyle  . Physical activity:    Days per week: Not on file    Minutes per session: Not on file  . Stress: Not on file  Relationships  . Social connections:    Talks on phone: Not on file    Gets together: Not on file    Attends religious service: Not on file    Active member of club or organization: Not on file    Attends meetings of clubs or organizations: Not on file    Relationship status: Not on file  . Intimate partner violence:    Fear of current or ex partner: Not on file    Emotionally abused: Not on file    Physically abused: Not on file    Forced sexual activity: Not on file  Other Topics Concern  . Not on file  Social History Narrative  . Not on file    Physical  Exam      Future Appointments  Date Time Provider Department Center  10/11/2018 10:00 AM [facility] ECHO 1-BUZZ [facility]  10/11/2018 11:00 AM Bensimhon, Bevelyn Buckles, MD [facility] None    BP 124/88   Pulse 95   Temp 98.5 F (36.9 C)   Wt 174 lb (78.9 kg)   SpO2 98%   BMI 26.46 kg/m   Weight yesterday-170-175  Last visit weight-178 @ clinic   First home visit with pt, pt has lived here for approx a year. He is from [place]. He reports his heart problems came from birth.  His wife just had a baby, he has 3 other children in the home with him.  No insurance-he needs a [place] ID to apply for the medicaid. --[facility] is closed right now due to COVID-19.--however [facility] is appointment only.  Pt denies sob. No dizziness. No edema noted. Pt reports taking all meds as prescribed. Right now they are from [facility] pharmacy when he  was d/c from hosp.   Med review--entresto-out of since Saturday--called entresto to schedule shipment-it will be delivered on Wednesday.  ---does not have the corlanor due to insurance-amy is holding off on this for now.  --lasix takes as needed  Wearing the life vest- advised him to keep it on at all times.   Kerry Hough, EMT-Paramedic [phone] [facility] Paramedic  07/05/18

## 2018-07-05 NOTE — Telephone Encounter (Signed)
CSW informed by community paramedic that pt is running low on his dijoxin and lasix.  CSW had clinic send in refill to [facility] pharmacy and called pharmacy to initiated mail order- per pharmacy meds should arrive Wednesday or Thursday of this week.  CSW will continue to follow and assist as needed  Burna Sis, LCSW Clinical Social Worker [facility] Desk#: [phone] Cell#: [phone]

## 2018-07-05 NOTE — Addendum Note (Signed)
Encounter addended by: Marisa Hua, RN on: 07/05/2018 3:12 PM  Actions taken: Order list changed

## 2018-07-05 NOTE — Addendum Note (Signed)
Encounter addended by: Marisa Hua, RN on: 07/05/2018 2:36 PM  Actions taken: Clinical Note Signed

## 2018-07-05 NOTE — Patient Instructions (Signed)
No medication changes today!   Your physician recommends that you schedule a follow-up appointment in: 1 week with NP for a follow up appointment to today's phone visit.  You will be called like todays visit with your Paramedic Katie.   Please call office if any questions or concerns.

## 2018-07-05 NOTE — Progress Notes (Signed)
Discussed AVS with patient, pt to expect call to schedule 1 week call. AVS sent via mychart. Pt appreciative.

## 2018-07-05 NOTE — Progress Notes (Signed)
Heart Failure TeleHealth Note  Due to national recommendations of social distancing due to COVID 19, Audio/video telehealth visit is felt to be most appropriate for this patient at this time.  See MyChart message from today for patient consent regarding telehealth for Bruce Morse.  Date:  07/05/2018   ID:  [name], DOB 1990/10/18, MRN [id number]  Location: Home  Provider location: [place] [facility] Type of Visit: Established patient   PCP:  Patient, No Pcp Per  Cardiologist:  No primary care provider on file. Primary HF: Dr Gala Romney   Chief Complaint:Heart Failure   History of Present Illness: Bruce Morse is a 28 y.o. male with a history of chronic systolic CHF, NICM, "congenital heart disease", ETOH abuse, Tobacco abuse, and THC use/abuse. Extensive history at [facility] work up was for the most part negative. He doesn't ever remember getting genetic testing, and thinks that the myocardial biopsy came back with "extra tissue" on the right side of his heart, but isn't sure about any more details  Admitted 3/5 - 06/02/18 with abdominal pain and significant history of congenital heart disease + orthopnea and LE edema. EKG with tachycardia and LVH. Echo showed new diagnosis of CHF with EF 20-25%. Taken for cath as below which demonstrated NICM. HF meds adjusted as tolerated. He was not discharged on bb.   Last I week I performed a telephone visit with him and he had never start corlanor due to cost. He was referred to HF Paramedicine for EKG.   He  presents via Special educational needs teacher for a telehealth visit today.   Overall feeling fine. Denies SOB/PND/Orthopnea. Using Life Vest and having no issues.  Appetite ok. No fever or chills. Weight at home has been stable. Taking all medications but he has been off entresto for the last 2 days. Smoking 3 cigarettes per day.    he denies symptoms worrisome for COVID 19.   Past Medical History:  Diagnosis Date  .  Alcohol abuse   . Chronic systolic (congestive) heart failure (HCC)   . Congenital heart disease   . Marijuana abuse   . Syncope    at age 47  . Tobacco abuse    Past Surgical History:  Procedure Laterality Date  . RIGHT/LEFT HEART CATH AND CORONARY ANGIOGRAPHY N/A 05/31/2018   Procedure: RIGHT/LEFT HEART CATH AND CORONARY ANGIOGRAPHY;  Surgeon: Dolores Patty, MD;  [facility];  Service: Cardiovascular;  Laterality: N/A;     Current Outpatient Medications  Medication Sig Dispense Refill  . spironolactone (ALDACTONE) 25 MG tablet Take 0.5 tablets (12.5 mg total) by mouth daily for 30 days. 15 tablet 5  . digoxin (LANOXIN) 0.25 MG tablet Take 1 tablet (0.25 mg total) by mouth daily for 30 days. 30 tablet 0  . furosemide (LASIX) 20 MG tablet Take 1 tablet (20 mg total) by mouth daily as needed for up to 30 days for fluid or edema (shortness of breath or leg swelling.). 30 tablet 0  . sacubitril-valsartan (ENTRESTO) 24-26 MG Take 1 tablet by mouth 2 (two) times daily for 30 days. (Patient not taking: Reported on 07/05/2018) 60 tablet 11   No current facility-administered medications for this encounter.     Allergies:   Patient has no known allergies.   Social History:  The patient  reports that he has been smoking. He has never used smokeless tobacco. He reports current alcohol use.   Family History:  The patient's family history is  not on file.   ROS:  Please see the history of present illness.   All other systems are personally reviewed and negative.   Exam:  Video Health Call; Exam is visual. General:  Speaks in full sentences. No resp difficulty.Life Vest in place. Lungs: Normal respiratory effort with conversation.  Abdomen: Non-distended per patient report Extremities: Pt denies edema. Neuro: Alert & oriented x 3.   EKG SR 90 bpm per 12 lead by Paramedic  Recent Labs: 05/28/2018: B Natriuretic Peptide 1,232.5; TSH 3.393 05/30/2018: ALT 45 06/01/2018:  Platelets 274 06/02/2018: Hemoglobin 16.0 06/10/2018: BUN 11; Creatinine, Ser 1.15; Magnesium 2.3; Potassium 4.3; Sodium 137  Personally reviewed   Wt Readings from Last 3 Encounters:  07/05/18 78.9 kg (174 lb)  07/05/18 78.9 kg (174 lb)  07/01/18 78.5 kg (173 lb)    Today's Vitals   07/05/18 1349  BP: 124/88  Pulse: 90  SpO2: 98%  Weight: 78.9 kg (174 lb)   Body mass index is 26.46 kg/m.   ASSESSMENT AND PLAN:  1. Chronic systolic heart failure, NICM - Normal coronaries by cath 05/31/2018 on cath EF 15%.  -Echo 05/28/2018 LVEF 20-25%, Mild LAE, Mod MR, Mod TR, Mild MR.  - NYHA I. Volume status sound stable. Continue lasix as needed.  - Continue Life Vest.  Plan to repeat ECHO in 3 months after HF medications optimized.  - Add bb next week. Hold off until we re-start entresto. I want to make sure he has enough bp.  - he has been out of entresto but will contact the company for medication assistance. Continue Entresto 24/26 mg BID.  - Continue spironolactone 12.5 mg daily -- Needs cMRI once he has insurance. Needs genetic counseling once he has insurance.  2. Congenital heart disease - Extensive work up at [facility] as above. No clear etiology - Will plan cMRI and genetic screening once insurance is in place.   3. H/o NSVT - Continue lifevest.Will need repeat Echo in 2-3 months with med titration to consider ICD if EF not improved.  4. ETOH abuse - He has stopped drinking. I congratulated.   5. Tobacco abuse He continues to cut back. Encouraged to stop drinking all together. Discussed cessation today.      COVID screen The patient does not have any symptoms that suggest any further testing/ screening at this time.  Social distancing reinforced today.  Relevant cardiac medications were reviewed at length with the patient today.   The patient does not have concerns regarding their medications at this time.   The following changes were made today:  None   Recommended follow-up:  1 week with HF Paramedic  Today, I have spent 15 minutes with the patient with telehealth technology discussing the above issues .    Waneta Martins, NP  07/05/2018 1:50 PM  [facility] [address] [facility] [place] [address] [phone] (office) [phone] (fax)

## 2018-07-08 ENCOUNTER — Telehealth (HOSPITAL_COMMUNITY): Payer: Self-pay | Admitting: Licensed Clinical Social Worker

## 2018-07-08 NOTE — Telephone Encounter (Signed)
CSW completed financial counseling chart review to see what has already been done to obtain patient insurance coverage.  Per notes Medicaid application for retro and ongoing [employer] coverage was submitted on 4/14- application now pending and pt Medicaid case worker is Amanda Cockayne [phone].  CSW will continue to follow and assist as needed  Burna Sis, LCSW Clinical Social Worker [facility] Desk#: [phone] Cell#: [phone]

## 2018-07-12 ENCOUNTER — Other Ambulatory Visit: Payer: Self-pay

## 2018-07-12 ENCOUNTER — Other Ambulatory Visit (HOSPITAL_COMMUNITY): Payer: Self-pay

## 2018-07-12 ENCOUNTER — Telehealth (HOSPITAL_COMMUNITY): Payer: Self-pay | Admitting: Licensed Clinical Social Worker

## 2018-07-12 ENCOUNTER — Ambulatory Visit (HOSPITAL_COMMUNITY): Admission: RE | Admit: 2018-07-12 | Payer: Self-pay | Source: Ambulatory Visit

## 2018-07-12 NOTE — Progress Notes (Signed)
Paramedicine Encounter    Patient ID: Bruce Morse, male    DOB: 1990/12/12, 28 y.o.   MRN: [id number]   Patient Care Team: Patient, No Pcp Per as PCP - General (General Practice)  Patient Active Problem List   Diagnosis Date Noted  . Chronic systolic congestive heart failure (HCC) 06/10/2018  . NICM (nonischemic cardiomyopathy) (HCC) 06/10/2018  . ETOH abuse 06/10/2018  . Tobacco abuse 06/10/2018  . Mild tetrahydrocannabinol (THC) abuse 06/10/2018  . AKI (acute kidney injury) (HCC) 05/29/2018  . Congenital heart disease 05/29/2018  . NSVT (nonsustained ventricular tachycardia) (HCC) 05/29/2018  . Hypoxemia   . Alcoholic cardiomyopathy (HCC)   . Alcohol use   . Acute systolic CHF (congestive heart failure) (HCC) 05/28/2018  . Left lower quadrant abdominal pain 05/28/2018  . Acute pulmonary edema (HCC) 05/28/2018    Current Outpatient Medications:  .  digoxin (LANOXIN) 0.25 MG tablet, Take 1 tablet (0.25 mg total) by mouth daily for 30 days., Disp: 30 tablet, Rfl: 5 .  furosemide (LASIX) 20 MG tablet, Take 1 tablet (20 mg total) by mouth daily as needed for up to 30 days for fluid or edema (shortness of breath or leg swelling.)., Disp: 30 tablet, Rfl: 5 .  spironolactone (ALDACTONE) 25 MG tablet, Take 0.5 tablets (12.5 mg total) by mouth daily for 30 days., Disp: 15 tablet, Rfl: 5 No Known Allergies    Social History   Socioeconomic History  . Marital status: Single    Spouse name: Not on file  . Number of children: Not on file  . Years of education: Not on file  . Highest education level: Not on file  Occupational History  . Not on file  Social Needs  . Financial resource strain: Not on file  . Food insecurity:    Worry: Not on file    Inability: Not on file  . Transportation needs:    Medical: Not on file    Non-medical: Not on file  Tobacco Use  . Smoking status: Current Every Day Smoker  . Smokeless tobacco: Never Used  Substance and Sexual Activity  .  Alcohol use: Yes    Comment: Daily.  . Drug use: Not on file  . Sexual activity: Not on file  Lifestyle  . Physical activity:    Days per week: Not on file    Minutes per session: Not on file  . Stress: Not on file  Relationships  . Social connections:    Talks on phone: Not on file    Gets together: Not on file    Attends religious service: Not on file    Active member of club or organization: Not on file    Attends meetings of clubs or organizations: Not on file    Relationship status: Not on file  . Intimate partner violence:    Fear of current or ex partner: Not on file    Emotionally abused: Not on file    Physically abused: Not on file    Forced sexual activity: Not on file  Other Topics Concern  . Not on file  Social History Narrative  . Not on file    Physical Exam      Future Appointments  Date Time Provider Department Center  10/11/2018 10:00 AM [facility] ECHO 1-BUZZ [facility]  10/11/2018 11:00 AM Bensimhon, Bevelyn Buckles, MD [facility] None    BP 114/80   Pulse 90   Temp (!) 97.5 F (36.4 C)   Resp 15   Wt  178 lb (80.7 kg)   SpO2 98%   BMI 27.06 kg/m   Weight yesterday-178 Last visit weight-174  Pt reports he is doing ok,  His weight got up to 180lbs-he has been taking lasix 20mg  for past 3 days in a row. He had some diet indiscretions. He missed his entresto delivery last week and [facility]--pt did go there to try to get it however when he arrived the staff advised it was not there and that it could have went somewhere else--it ended up being the label was over his name and they couldn't hardly see it so I was able to pick it up and take it to him. He will take first dose now.  His entresto is not listed in the chart today but it was last visit. He has all his other meds.  He denies increased sob. No edema noted. No complaints or issues at this time. I advised him DMV is taking appointments for ID he just has to call.  Will do  another virtual visit next week since he has not taken his entresto since last week.   Kerry Hough, EMT-Paramedic [phone] [facility] Paramedic  07/12/18

## 2018-07-12 NOTE — Telephone Encounter (Signed)
CSW informed by community paramedic that patient missed [employer] delivery of his Sherryll Burger and that it was forwarded to a [facility] pick [facility] point but when he went to get it from [facility] they told him they did not have the package.  [facility] called the [facility] to inquire- they originally informed CSW that they still did not have the package but were then able to locate it by address as the patients name had been covered up.  CSW updated community paramedic who will pick up medication for patient.  CSW will continue to follow and assist as needed  Burna Sis, LCSW Clinical Social Worker [facility] Desk#: [phone] Cell#: [phone]

## 2018-07-19 ENCOUNTER — Ambulatory Visit (HOSPITAL_COMMUNITY): Admission: RE | Admit: 2018-07-19 | Payer: Self-pay | Source: Ambulatory Visit

## 2018-07-19 ENCOUNTER — Telehealth (HOSPITAL_COMMUNITY): Payer: Self-pay

## 2018-07-19 ENCOUNTER — Other Ambulatory Visit: Payer: Self-pay

## 2018-07-19 NOTE — Telephone Encounter (Signed)
Spoke to [place] and advised him Bruce Morse is out of the office today but will be returning tomorrow to follow up. Patient understood and agreed.

## 2018-07-20 ENCOUNTER — Telehealth (HOSPITAL_COMMUNITY): Payer: Self-pay

## 2018-07-20 NOTE — Telephone Encounter (Signed)
Attempted to reach pt regarding home visit, he did not answer-LVM for him to return my call.   Kerry Hough, EMT-Paramedic  07/20/18

## 2018-07-22 ENCOUNTER — Ambulatory Visit (HOSPITAL_COMMUNITY)
Admission: RE | Admit: 2018-07-22 | Discharge: 2018-07-22 | Disposition: A | Discharge status: Home / Self Care | Payer: Self-pay | Source: Ambulatory Visit | Attending: Internal Medicine | Admitting: Internal Medicine

## 2018-07-22 ENCOUNTER — Other Ambulatory Visit: Payer: Self-pay

## 2018-07-22 ENCOUNTER — Encounter (HOSPITAL_COMMUNITY): Payer: Self-pay

## 2018-07-22 ENCOUNTER — Other Ambulatory Visit (HOSPITAL_COMMUNITY): Payer: Self-pay

## 2018-07-22 VITALS — BP 98/72 | HR 90 | Wt 174.0 lb

## 2018-07-22 DIAGNOSIS — Z720 Tobacco use: Secondary | ICD-10-CM

## 2018-07-22 DIAGNOSIS — I428 Other cardiomyopathies: Secondary | ICD-10-CM

## 2018-07-22 DIAGNOSIS — I4729 Other ventricular tachycardia: Secondary | ICD-10-CM

## 2018-07-22 DIAGNOSIS — F1010 Alcohol abuse, uncomplicated: Secondary | ICD-10-CM

## 2018-07-22 DIAGNOSIS — I472 Ventricular tachycardia: Secondary | ICD-10-CM

## 2018-07-22 DIAGNOSIS — I5022 Chronic systolic (congestive) heart failure: Secondary | ICD-10-CM

## 2018-07-22 NOTE — Progress Notes (Signed)
Heart Failure TeleHealth Note  Due to national recommendations of social distancing due to COVID 19, Audio/video telehealth visit is felt to be most appropriate for this patient at this time.  See MyChart message from today for patient consent regarding telehealth for [facility].  Date:  07/22/2018   ID:  Bruce Morse, DOB 1990-12-03, MRN [id number]  Location: Home  Provider location: [place] [facility] Type of Visit: Established patient   PCP:  Patient, No Pcp Per  Cardiologist:  No primary care provider on file. Primary HF: Dr Gala Romney  Chief Complaint: Heart Failure   History of Present Illness: Bruce Morse is a 28 y.o. male with a history of chronic systolic CHF, NICM, "congenital heart disease", ETOH abuse, Tobacco abuse, and THC use/abuse. Extensive history at [facility] work up was for the most part negative. 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 EKG with tachycardia and LVH. Echo showed new diagnosis of CHF with EF 20-25%. Taken for cath as below which demonstrated NICM. HF meds adjusted as tolerated. He was not discharged on bb.  He presents via Special educational needs teacher for a telehealth visit today.  Since the last visit he has been on entresto for 10 days. He has had lasix about 3 times for weight gain. . Overall feeling fine. Says he is  Denies SOB/PND/Orthopnea. He continues wear Life Vest. No issues with Life Vest.  Appetite ok. No fever or chills. Weight at home 169-180 pounds. Taking all medications. Smoking 5 cigarettes per day. He is not drinking alcohol.    he denies symptoms worrisome for COVID 19.   Past Medical History:  Diagnosis Date  . Alcohol abuse   . Chronic  systolic (congestive) heart failure (HCC)   . Congenital heart disease   . Marijuana abuse   . Syncope    at age 28  . Tobacco abuse    Past Surgical History:  Procedure Laterality Date  . RIGHT/LEFT HEART CATH AND CORONARY ANGIOGRAPHY N/A 05/31/2018   Procedure: RIGHT/LEFT HEART CATH AND CORONARY ANGIOGRAPHY;  Surgeon: Dolores Patty, MD;  [facility];  Service: Cardiovascular;  Laterality: N/A;     Current Outpatient Medications  Medication Sig Dispense Refill  . digoxin (LANOXIN) 0.25 MG tablet Take 1 tablet (0.25 mg total) by mouth daily for 30 days. 30 tablet 5  . furosemide (LASIX) 20 MG tablet Take 1 tablet (20 mg total) by mouth daily as needed for up to 30 days for fluid or edema (shortness of breath or leg swelling.). 30 tablet 5  . sacubitril-valsartan (ENTRESTO) 24-26 MG Take 1 tablet by mouth 2 (two) times daily.    Marland Kitchen spironolactone (ALDACTONE) 25 MG tablet Take 0.5 tablets (12.5 mg total) by mouth daily for 30 days. 15 tablet 5   No current facility-administered medications for this encounter.     Allergies:   Patient has no known allergies.   Social History:  The patient  reports that he has been smoking. He has never used smokeless tobacco. He reports current alcohol use.   Family History:  The patient's family history is not on file.   ROS:  Please see the history of present illness.   All other systems are  personally reviewed and negative.   Exam:  Video Health Call; Exam is visual. General:  Speaks in full sentences. No resp difficulty. Lungs: Normal respiratory effort with conversation.  Abdomen: Non-distended per patient report Extremities: Pt denies edema. Neuro: Alert & oriented x 3.   Recent Labs: 05/28/2018: B Natriuretic Peptide 1,232.5; TSH 3.393 05/30/2018: ALT 45 06/01/2018: Platelets 274 06/02/2018: Hemoglobin 16.0 06/10/2018: BUN 11; Creatinine, Ser 1.15; Magnesium 2.3; Potassium 4.3; Sodium 137  Personally reviewed   Wt  Readings from Last 3 Encounters:  07/22/18 78.9 kg (174 lb)  07/22/18 78.9 kg (174 lb)  07/12/18 80.7 kg (178 lb)    Vitals:   07/22/18 1219  BP: 98/72  Pulse: 90  SpO2: 98%     ASSESSMENT AND PLAN:   1. Chronic systolic heart failure, NICM - Normal coronaries by cath 3/9/2020on cath EF 15%. -Echo 05/28/2018 LVEF 20-25%, Mild LAE, Mod MR, Mod TR, Mild MR.  -NYHA I. Volume status sound stable. Continue lasix as needed. I have asked him to hold not take lasix for now with addition of entresto.  - Continue Life Vest. Plan to repeat ECHO in 3 months after HF medications optimized. This is set up for July .  - Hold off on bb with soft SBP.   -  Continue Entresto 24/26 mg BID. Check BMET next week.  - Continue spironolactone 12.5 mg daily -- Needs cMRI once he has insurance. Needs genetic counseling once he has insurance.  2. Congenital heart disease - Extensive work up at [facility] as above. No clear etiology - Will plan cMRI and genetic screeningonce insurance is in place.  3. H/o NSVT - Continue lifevest. - Repeat ECHO in July.   4. ETOH abuse -Congratulated on abstinence.   5. Tobacco abuse Discussed smoking cessation.   COVID screen The patient does not have any symptoms that suggest any further testing/ screening at this time.  Social distancing reinforced today.  Patient Risk: After full review of this patients clinical status, I feel that they are at moderate risk for cardiac decompensation at this time.  Relevant cardiac medications were reviewed at length with the patient today. The patient does not have concerns regarding their medications at this time.   The following changes were made today:  BMET next week.  Recommended follow-up:  4 week virtual visit.   Today, I have spent 15 minutes with the patient with telehealth technology discussing the above issues .    Waneta Martins, NP  07/22/2018 12:24 PM  [facility]  Health [address] Heart and Vascular [place] [address] [phone] (office) [phone] (fax)

## 2018-07-22 NOTE — Patient Instructions (Signed)
Lab scheduled for Monday 5/4  Follow up telehealth visit with Tonye Becket, NP on Wed 5/27 at 8:30 AM

## 2018-07-22 NOTE — Progress Notes (Addendum)
Paramedicine Encounter    Patient ID: Bruce Morse, male    DOB: Oct 17, 1990, 28 y.o.   MRN: [id number]   Patient Care Team: Patient, No Pcp Per as PCP - General (General Practice)  Patient Active Problem List   Diagnosis Date Noted  . Chronic systolic congestive heart failure (HCC) 06/10/2018  . NICM (nonischemic cardiomyopathy) (HCC) 06/10/2018  . ETOH abuse 06/10/2018  . Tobacco abuse 06/10/2018  . Mild tetrahydrocannabinol (THC) abuse 06/10/2018  . AKI (acute kidney injury) (HCC) 05/29/2018  . Congenital heart disease 05/29/2018  . NSVT (nonsustained ventricular tachycardia) (HCC) 05/29/2018  . Hypoxemia   . Alcoholic cardiomyopathy (HCC)   . Alcohol use   . Acute systolic CHF (congestive heart failure) (HCC) 05/28/2018  . Left lower quadrant abdominal pain 05/28/2018  . Acute pulmonary edema (HCC) 05/28/2018    Current Outpatient Medications:  .  digoxin (LANOXIN) 0.25 MG tablet, Take 1 tablet (0.25 mg total) by mouth daily for 30 days., Disp: 30 tablet, Rfl: 5 .  furosemide (LASIX) 20 MG tablet, Take 1 tablet (20 mg total) by mouth daily as needed for up to 30 days for fluid or edema (shortness of breath or leg swelling.)., Disp: 30 tablet, Rfl: 5 .  spironolactone (ALDACTONE) 25 MG tablet, Take 0.5 tablets (12.5 mg total) by mouth daily for 30 days., Disp: 15 tablet, Rfl: 5 No Known Allergies    Social History   Socioeconomic History  . Marital status: Single    Spouse name: Not on file  . Number of children: Not on file  . Years of education: Not on file  . Highest education level: Not on file  Occupational History  . Not on file  Social Needs  . Financial resource strain: Not on file  . Food insecurity:    Worry: Not on file    Inability: Not on file  . Transportation needs:    Medical: Not on file    Non-medical: Not on file  Tobacco Use  . Smoking status: Current Every Day Smoker  . Smokeless tobacco: Never Used  Substance and Sexual Activity  .  Alcohol use: Yes    Comment: Daily.  . Drug use: Not on file  . Sexual activity: Not on file  Lifestyle  . Physical activity:    Days per week: Not on file    Minutes per session: Not on file  . Stress: Not on file  Relationships  . Social connections:    Talks on phone: Not on file    Gets together: Not on file    Attends religious service: Not on file    Active member of club or organization: Not on file    Attends meetings of clubs or organizations: Not on file    Relationship status: Not on file  . Intimate partner violence:    Fear of current or ex partner: Not on file    Emotionally abused: Not on file    Physically abused: Not on file    Forced sexual activity: Not on file  Other Topics Concern  . Not on file  Social History Narrative  . Not on file    Physical Exam      Future Appointments  Date Time Provider Department Center  10/11/2018 10:00 AM [facility] ECHO 1-BUZZ [facility]  10/11/2018 11:00 AM Bensimhon, Bevelyn Buckles, MD [facility] None    BP 98/72   Pulse 90   Resp 15   Wt 174 lb (78.9 kg)   SpO2 98%  BMI 26.46 kg/m     B/p standing-114/80 HR standing-60  Recheck-HR standing-118  B/p- sitting-101/67  B/p- standing-108/66 12 lead was done   Weight yesterday-169  Last visit weight-178  Pt reports he is doing well, no c/p, no dizziness, no h/a, pt denies sob, he has flight of stairs in home and he denies any issues with that.  Orthostatics as noted.  Will need to get spiro refilled for him.  He reports taking frequent use of lasix 3x this past week-his weight got up to 180. He had much increased urine output. He was eating a lot of high sodium foods. During that time he did not notice any swelling or symptoms.  Pt is going out of town next week, he will need spiro refilled prior to him going. Everything else he should be good on. I will go by and pick up his med for him to ensure he gets it before he leaves town.  Last dose of his lasix was  yesterday. States his fluid level is maintaining under 2L.   Kerry Hough, EMT-Paramedic [phone] [facility] Paramedic  07/22/18

## 2018-07-22 NOTE — Addendum Note (Signed)
Encounter addended by: Noralee Space, RN on: 07/22/2018 12:35 PM  Actions taken: Order list changed, Diagnosis association updated, Clinical Note Signed

## 2018-07-23 ENCOUNTER — Telehealth (HOSPITAL_COMMUNITY): Payer: Self-pay | Admitting: Licensed Clinical Social Worker

## 2018-07-23 MED FILL — SPIRONOLACTONE 25 MG TABS: 25 | 30 days supply | Qty: 15 | Fill #1

## 2018-07-23 NOTE — Telephone Encounter (Signed)
Community paramedic requested CSW have pt spirolactone filled so she could pick it up for pt on Monday.  CSW called [facility] pharmacy and requested pt medication be filled.  CSW will continue to follow and assist as needed  Burna Sis, LCSW Clinical Social Worker [facility] Desk#: [phone] Cell#: [phone]

## 2018-07-26 ENCOUNTER — Ambulatory Visit (HOSPITAL_COMMUNITY)
Admission: RE | Admit: 2018-07-26 | Discharge: 2018-07-26 | Disposition: A | Discharge status: Home / Self Care | Payer: Self-pay | Source: Ambulatory Visit | Attending: Internal Medicine | Admitting: Internal Medicine

## 2018-07-26 ENCOUNTER — Other Ambulatory Visit: Payer: Self-pay

## 2018-07-26 ENCOUNTER — Other Ambulatory Visit (HOSPITAL_COMMUNITY): Payer: Self-pay

## 2018-07-26 DIAGNOSIS — I5022 Chronic systolic (congestive) heart failure: Secondary | ICD-10-CM | POA: Insufficient documentation

## 2018-07-26 LAB — BASIC METABOLIC PANEL
Anion gap: 10 (ref 5–15)
BUN: 13 mg/dL (ref 6–20)
CO2: 22 mmol/L (ref 22–32)
Calcium: 9.5 mg/dL (ref 8.9–10.3)
Chloride: 105 mmol/L (ref 98–111)
Creatinine, Ser: 1.2 mg/dL (ref 0.61–1.24)
GFR calc Af Amer: >60 mL/min (ref >60)
GFR calc non Af Amer: >60 mL/min (ref >60)
Glucose, Bld: 112 mg/dL — ABNORMAL HIGH (ref 70–99)
Potassium: 4.1 mmol/L (ref 3.5–5.1)
Sodium: 137 mmol/L (ref 135–145)

## 2018-07-26 NOTE — Progress Notes (Signed)
I picked up pts spiro today at [facility] for him to ensure he had it prior to him leaving out of town today. I dropped it off for him. He states he will contact me once he returns back to [place] and will f/u at that time.   Kerry Hough, EMT-Paramedic  07/26/18

## 2018-08-04 ENCOUNTER — Encounter (HOSPITAL_COMMUNITY): Payer: Self-pay

## 2018-08-09 ENCOUNTER — Encounter (HOSPITAL_COMMUNITY): Payer: Self-pay

## 2018-08-09 ENCOUNTER — Encounter (HOSPITAL_COMMUNITY): Payer: Self-pay | Admitting: *Deleted

## 2018-08-09 NOTE — Telephone Encounter (Signed)
Called pt to see what kind of work he does no answer/left VM. I sent mychart message requesting more information.  (see previous messages below)   What does he do for work? Looks like he had no functional limitations when he had visit with Amy. I can double check with DB since his EF is low and he's wearing LifeVest. Let me know what he does first though. I do not know about his disability. Thanks  ----- Message -----  From: Bruce Morse, CMA  Sent: 08/06/2018  8:56 AM EDT  To: Bruce Hess, NP  Subject: FW: Non-Urgent Medical Question           Please advise.   ----- Message -----  From: Bruce Morse  Sent: 08/04/2018 12:09 PM EDT  To: [facility] Clinical Pool  Subject: Non-Urgent Medical Question             Is there a possibility that I can get a written letter on the letterhead stating that I cannot work, Also if my disability is permanent or temporary and also can you put on there the next day for my next doctor visit and is it possible that it can be signed by the doctor. This letter has to be sent to the courthouse and I am able to send it if you can email it to me.

## 2018-08-17 NOTE — Progress Notes (Signed)
Heart Failure TeleHealth Note  Due to national recommendations of social distancing due to COVID 19, Audio/video telehealth visit is felt to be most appropriate for this patient at this time.  See MyChart message from today for patient consent regarding telehealth for [facility].  Date:  08/18/2018   ID:  Bruce Morse, DOB 08/26/1990, MRN [id number]  Location: Home  Provider location: [place] [facility] Type of Visit: Established patient   PCP:  Patient, No Pcp Per  Cardiologist:  No primary care provider on file. Primary HF: Dr Gala Romney   Chief Complaint: Heart Failure   History of Present Illness: Bruce Morse is a 28 y.o. male with a history of chronic systolic CHF, NICM, "congenital heart disease", ETOH abuse, Tobacco abuse, and THC use/abuse. Extensive history at [facility] work up was for the most part negative. 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 EKG with tachycardia and LVH. Echo showed new diagnosis of CHF with EF 20-25%. Taken for cath as below which demonstrated NICM. HF meds adjusted as tolerated. He was not discharged on bb.  He presents via Web designer for a telehealth visit today.  He had telehealth visit on 07/05/18 and was started back on entresto. Overall feeling fine. Denies SOB/PND/Orthopnea. He continues to wear Life Vest. Appetite ok. No fever or chills. Weight at home 178  pounds. Taking all medications. Smoking about 3 cigarettes per day.   he denies symptoms worrisome for COVID 19.   Past Medical History:  Diagnosis Date  . Alcohol abuse   . Chronic systolic (congestive) heart failure (HCC)   . Congenital heart disease   . Marijuana abuse   .  Syncope    at age 32  . Tobacco abuse    Past Surgical History:  Procedure Laterality Date  . RIGHT/LEFT HEART CATH AND CORONARY ANGIOGRAPHY N/A 05/31/2018   Procedure: RIGHT/LEFT HEART CATH AND CORONARY ANGIOGRAPHY;  Surgeon: Dolores Patty, MD;  [facility];  Service: Cardiovascular;  Laterality: N/A;     Current Outpatient Medications  Medication Sig Dispense Refill  . digoxin (LANOXIN) 0.25 MG tablet Take 1 tablet (0.25 mg total) by mouth daily for 30 days. 30 tablet 5  . furosemide (LASIX) 20 MG tablet Take 1 tablet (20 mg total) by mouth daily as needed for up to 30 days for fluid or edema (shortness of breath or leg swelling.). 30 tablet 5  . sacubitril-valsartan (ENTRESTO) 24-26 MG Take 1 tablet by mouth 2 (two) times daily.    Marland Kitchen spironolactone (ALDACTONE) 25 MG tablet Take 0.5 tablets (12.5 mg total) by mouth daily for 30 days. 15 tablet 5   No current facility-administered medications for this encounter.     Allergies:   Patient has no known allergies.   Social History:  The patient  reports that he has been smoking. He has never used smokeless tobacco. He reports current alcohol use.   Family History:  The patient's family history is not on file.   ROS:  Please see the history of present illness.   All other systems are personally reviewed and negative.  Vitals:   08/18/18 0926  BP: 130/84  Pulse: 82  Temp: (!) 97.2 F (36.2 C)  SpO2: 98%    Exam:  Video Health Call; Exam is visual. Zoll Life Vest on.  General:  Speaks in full sentences. No resp difficulty. Lungs: Normal respiratory effort with conversation.  Abdomen: Non-distended per patient report Extremities: Pt denies edema. Neuro: Alert & oriented x 3.   Recent Labs: 05/28/2018: B Natriuretic Peptide 1,232.5; TSH 3.393 05/30/2018: ALT 45 06/01/2018: Platelets 274 06/02/2018: Hemoglobin 16.0 06/10/2018: Magnesium 2.3 07/26/2018: BUN 13; Creatinine, Ser 1.20; Potassium 4.1; Sodium 137   Personally reviewed   Wt Readings from Last 3 Encounters:  08/18/18 80.7 kg (178 lb)  07/22/18 78.9 kg (174 lb)  07/22/18 78.9 kg (174 lb)      ASSESSMENT AND PLAN:  . Chronic systolic heart failure, NICM - Normal coronaries by cath 3/9/2020on cath EF 15%. -Echo 05/28/2018 LVEF 20-25%, Mild LAE, Mod MR, Mod TR, Mild MR.  -NYHA I. Volume status stable.  - Continue Life Vest. Plan to repeat ECHO in 3 months after HF medications optimized.This is set up for July .  - Start carvedilol 3.125 mg twice a day.  Average heart on Zoll Interrogation is 95.  -Continue Entresto 24/26 mg BID. he will be given samples today.  - Continue spironolactone 12.5 mg daily. -- Needs cMRI once he has insurance. Needs genetic counseling once he has insurance.  2. Congenital heart disease - Extensive work up at [facility] as above. No clear etiology - Will plan cMRI and genetic screeningonce insurance is in place.  3. H/o NSVT - Continue lifevest.No events on Life Vest interrogation.  - Repeat ECHO in July.   4. ETOH abuse -Congratulated on abstinence.    5. Tobacco abuse Discussed smoking cessation.   COVID screen The patient does not have any symptoms that suggest any further testing/ screening at this time.  Social distancing reinforced today.  Patient Risk: After full review of this patients clinical status, I feel that they are at moderate risk for cardiac decompensation at this time.  Relevant cardiac medications were reviewed at length with the patient today. The patient does not have concerns regarding their medications at this time.   The following changes were made today:  Add 3.125 mg carvedilol twice a day.  Recommended follow-up:  Follow up in 3 weeks with Amy for virtual visit with Katie.   Today, I have spent 15  minutes with the patient with telehealth technology discussing the above issues .    Waneta Martins, NP  08/18/2018 9:27 AM  [facility] [address] Heart and Vascular [place] [address] [phone] (office) [phone] (fax)

## 2018-08-18 ENCOUNTER — Encounter (HOSPITAL_COMMUNITY): Payer: Self-pay

## 2018-08-18 ENCOUNTER — Other Ambulatory Visit (HOSPITAL_COMMUNITY): Payer: Self-pay

## 2018-08-18 ENCOUNTER — Other Ambulatory Visit: Payer: Self-pay

## 2018-08-18 ENCOUNTER — Telehealth (HOSPITAL_COMMUNITY): Payer: Self-pay

## 2018-08-18 ENCOUNTER — Ambulatory Visit (HOSPITAL_COMMUNITY)
Admission: RE | Admit: 2018-08-18 | Discharge: 2018-08-18 | Disposition: A | Discharge status: Home / Self Care | Payer: Self-pay | Source: Ambulatory Visit | Attending: Internal Medicine | Admitting: Internal Medicine

## 2018-08-18 VITALS — BP 130/84 | HR 82 | Temp 97.2°F | Wt 178.0 lb

## 2018-08-18 DIAGNOSIS — I428 Other cardiomyopathies: Secondary | ICD-10-CM

## 2018-08-18 DIAGNOSIS — I472 Ventricular tachycardia: Secondary | ICD-10-CM

## 2018-08-18 DIAGNOSIS — I5022 Chronic systolic (congestive) heart failure: Secondary | ICD-10-CM

## 2018-08-18 DIAGNOSIS — I4729 Other ventricular tachycardia: Secondary | ICD-10-CM

## 2018-08-18 DIAGNOSIS — Z720 Tobacco use: Secondary | ICD-10-CM

## 2018-08-18 MED ORDER — CARVEDILOL 3.125 MG PO TABS: 3.1250 mg | ORAL_TABLET | Freq: Two times a day (BID) | ORAL | 5 refills | Status: DC

## 2018-08-18 MED FILL — CARVEDILOL 3.125 MG TABLET: 3.125 | 30 days supply | Qty: 60 | Fill #0

## 2018-08-18 NOTE — Telephone Encounter (Signed)
Letter written will print and get signature. Pt aware.

## 2018-08-18 NOTE — Progress Notes (Signed)
Paramedicine Encounter    Patient ID: Baldeep Mattheis, male    DOB: [birth year], 28 y.o.   MRN: [id number]   Patient Care Team: Patient, No Pcp Per as PCP - General (General Practice)  Patient Active Problem List   Diagnosis Date Noted  . Chronic systolic congestive heart failure (HCC) 06/10/2018  . NICM (nonischemic cardiomyopathy) (HCC) 06/10/2018  . ETOH abuse 06/10/2018  . Tobacco abuse 06/10/2018  . Mild tetrahydrocannabinol (THC) abuse 06/10/2018  . AKI (acute kidney injury) (HCC) 05/29/2018  . Congenital heart disease 05/29/2018  . NSVT (nonsustained ventricular tachycardia) (HCC) 05/29/2018  . Hypoxemia   . Alcoholic cardiomyopathy (HCC)   . Alcohol use   . Acute systolic CHF (congestive heart failure) (HCC) 05/28/2018  . Left lower quadrant abdominal pain 05/28/2018  . Acute pulmonary edema (HCC) 05/28/2018    Current Outpatient Medications:  .  sacubitril-valsartan (ENTRESTO) 24-26 MG, Take 1 tablet by mouth 2 (two) times daily., Disp: , Rfl:  .  digoxin (LANOXIN) 0.25 MG tablet, Take 1 tablet (0.25 mg total) by mouth daily for 30 days., Disp: 30 tablet, Rfl: 5 .  furosemide (LASIX) 20 MG tablet, Take 1 tablet (20 mg total) by mouth daily as needed for up to 30 days for fluid or edema (shortness of breath or leg swelling.)., Disp: 30 tablet, Rfl: 5 .  spironolactone (ALDACTONE) 25 MG tablet, Take 0.5 tablets (12.5 mg total) by mouth daily for 30 days., Disp: 15 tablet, Rfl: 5 No Known Allergies    Social History   Socioeconomic History  . Marital status: Single    Spouse name: Not on file  . Number of children: Not on file  . Years of education: Not on file  . Highest education level: Not on file  Occupational History  . Not on file  Social Needs  . Financial resource strain: Not on file  . Food insecurity:    Worry: Not on file    Inability: Not on file  . Transportation needs:    Medical: Not on file    Non-medical: Not on file  Tobacco Use  . Smoking  status: Current Every Day Smoker  . Smokeless tobacco: Never Used  Substance and Sexual Activity  . Alcohol use: Yes    Comment: Daily.  . Drug use: Not on file  . Sexual activity: Not on file  Lifestyle  . Physical activity:    Days per week: Not on file    Minutes per session: Not on file  . Stress: Not on file  Relationships  . Social connections:    Talks on phone: Not on file    Gets together: Not on file    Attends religious service: Not on file    Active member of club or organization: Not on file    Attends meetings of clubs or organizations: Not on file    Relationship status: Not on file  . Intimate partner violence:    Fear of current or ex partner: Not on file    Emotionally abused: Not on file    Physically abused: Not on file    Forced sexual activity: Not on file  Other Topics Concern  . Not on file  Social History Narrative  . Not on file    Physical Exam      Future Appointments  Date Time Provider Department Center  10/11/2018 10:00 AM [facility] ECHO OP 1 [facility]  10/11/2018 11:00 AM Bensimhon, Bevelyn Buckles, MD [facility] None    BP  130/84   Pulse 82   Temp (!) 97.2 F (36.2 C)   Resp 15   SpO2 98%   Weight yesterday-178 Last visit weight-174  Pt recently returned from out of town trip, just got back last night. He denies any issues while he was gone. He denies sob, no dizziness, no c/p.  He states he just ran out of his entresto last night. He called to order it however the auto service stated it was going to be here April 12. So I will call to get it asap.  He did pick up a few pounds while he was goine. No edema noted.  Virtual visit today with amy-- Adding carvedilol 3.125mg  BID. -I will p/u from [facility] and take to him along with some entresto samples to get him covered until his entresto is mailed out.  I called entresto and ordered it and it will be there Friday.    Kerry Hough, EMT-Paramedic [phone] [facility]  Paramedic  08/18/18

## 2018-08-18 NOTE — Addendum Note (Signed)
Encounter addended by: Marisa Hua, RN on: 08/18/2018 9:55 AM  Actions taken: Pharmacy for encounter modified, Order list changed, Clinical Note Signed

## 2018-08-18 NOTE — Patient Instructions (Addendum)
START  Carvedilol 3.125mg  (1 tab) twice a day.   Your physician recommends that you schedule a follow-up appointment in: 3 weeks with Katie for VIRTUAL visit.   Florentina Addison will pick up samples of Entresto from the HF office.

## 2018-08-18 NOTE — Telephone Encounter (Signed)
2 bottles ENTRESTO [id number] EXP JUL 2022 given to Katie paramed for pt

## 2018-08-18 NOTE — Progress Notes (Signed)
Called patient to discuss AVS, instructions given. Questions answered. Verbalized understanding. Instructions sent via mychart as requested.

## 2018-08-26 ENCOUNTER — Other Ambulatory Visit (HOSPITAL_COMMUNITY): Payer: Self-pay

## 2018-08-26 ENCOUNTER — Telehealth (HOSPITAL_COMMUNITY): Payer: Self-pay | Admitting: Licensed Clinical Social Worker

## 2018-08-26 MED ORDER — DIGOXIN 250 MCG PO TABS: 0.2500 mg | ORAL_TABLET | Freq: Every day | ORAL | 5 refills | Status: DC

## 2018-08-26 MED ORDER — SPIRONOLACTONE 25 MG PO TABS: 12.5000 mg | ORAL_TABLET | Freq: Every day | ORAL | 5 refills | Status: DC

## 2018-08-26 MED FILL — SPIRONOLACTONE 25 MG TABLET: 25 | 30 days supply | Qty: 15 | Fill #0

## 2018-08-26 MED FILL — DIGOXIN 250 MCG TAB: 250 | 30 days supply | Qty: 30 | Fill #0

## 2018-08-26 NOTE — Telephone Encounter (Signed)
Received message from SW requesting patient med refills. Sent to [facility] outpatient pharmacy

## 2018-08-26 NOTE — Telephone Encounter (Signed)
CSW reached out to pt to check in regarding food and medication status at this time. Per paramedicine pt needs refills of his digoxin and spirolactone- CSW requested clinic send in.  Pt also having trouble getting his Entresto- patient wasn't home when they attempted delivery so they sent it to a local [facility] to hold but when pt went to pick it up it was not there.  CSW called [facility] and they confirmed they have the patients package and he needs to bring a picture ID to pick up- pt updated.  CSW encouraged pt to reach out with any concerns and will continue to follow and assist as needed  Burna Sis, LCSW Clinical Social Worker [facility] [phone]

## 2018-09-01 ENCOUNTER — Other Ambulatory Visit (HOSPITAL_COMMUNITY): Payer: Self-pay

## 2018-09-01 NOTE — Progress Notes (Signed)
Paramedicine Encounter    Patient ID: Bruce Morse, male    DOB: 08/12/1990, 28 y.o.   MRN: [id number]   Patient Care Team: Patient, No Pcp Per as PCP - General (General Practice)  Patient Active Problem List   Diagnosis Date Noted  . Chronic systolic congestive heart failure (HCC) 06/10/2018  . NICM (nonischemic cardiomyopathy) (HCC) 06/10/2018  . ETOH abuse 06/10/2018  . Tobacco abuse 06/10/2018  . Mild tetrahydrocannabinol (THC) abuse 06/10/2018  . AKI (acute kidney injury) (HCC) 05/29/2018  . Congenital heart disease 05/29/2018  . NSVT (nonsustained ventricular tachycardia) (HCC) 05/29/2018  . Hypoxemia   . Alcoholic cardiomyopathy (HCC)   . Alcohol use   . Acute systolic CHF (congestive heart failure) (HCC) 05/28/2018  . Left lower quadrant abdominal pain 05/28/2018  . Acute pulmonary edema (HCC) 05/28/2018    Current Outpatient Medications:  .  carvedilol (COREG) 3.125 MG tablet, Take 1 tablet (3.125 mg total) by mouth 2 (two) times daily., Disp: 60 tablet, Rfl: 5 .  digoxin (LANOXIN) 0.25 MG tablet, Take 1 tablet (0.25 mg total) by mouth daily for 30 days., Disp: 30 tablet, Rfl: 5 .  sacubitril-valsartan (ENTRESTO) 24-26 MG, Take 1 tablet by mouth 2 (two) times daily., Disp: , Rfl:  .  spironolactone (ALDACTONE) 25 MG tablet, Take 0.5 tablets (12.5 mg total) by mouth daily for 30 days., Disp: 15 tablet, Rfl: 5 .  furosemide (LASIX) 20 MG tablet, Take 1 tablet (20 mg total) by mouth daily as needed for up to 30 days for fluid or edema (shortness of breath or leg swelling.)., Disp: 30 tablet, Rfl: 5 No Known Allergies    Social History   Socioeconomic History  . Marital status: Single    Spouse name: Not on file  . Number of children: Not on file  . Years of education: Not on file  . Highest education level: Not on file  Occupational History  . Not on file  Social Needs  . Financial resource strain: Not on file  . Food insecurity:    Worry: Not on file   Inability: Not on file  . Transportation needs:    Medical: Not on file    Non-medical: Not on file  Tobacco Use  . Smoking status: Current Every Day Smoker  . Smokeless tobacco: Never Used  Substance and Sexual Activity  . Alcohol use: Yes    Comment: Daily.  . Drug use: Not on file  . Sexual activity: Not on file  Lifestyle  . Physical activity:    Days per week: Not on file    Minutes per session: Not on file  . Stress: Not on file  Relationships  . Social connections:    Talks on phone: Not on file    Gets together: Not on file    Attends religious service: Not on file    Active member of club or organization: Not on file    Attends meetings of clubs or organizations: Not on file    Relationship status: Not on file  . Intimate partner violence:    Fear of current or ex partner: Not on file    Emotionally abused: Not on file    Physically abused: Not on file    Forced sexual activity: Not on file  Other Topics Concern  . Not on file  Social History Narrative  . Not on file    Physical Exam      Future Appointments  Date Time Provider Department Center  09/09/2018  9:00 AM [facility] PA/NP [facility] None  10/11/2018 10:00 AM [facility] ECHO OP 1 [facility]  10/11/2018 11:00 AM Bensimhon, Shaune Pascal, MD [facility] None    BP 118/82   Pulse 94   Temp 97.7 F (36.5 C)   Resp 15   Wt 178 lb (80.7 kg)   SpO2 98%   BMI 27.06 kg/m   Weight yesterday-175 Last visit weight-178  Pt states he is doing well, he denies sob. No dizziness or issues with adding carvedilol.  His life vest is causing him some irritation and dryness to the left lower side--its dry and flaky--he has been washing the vest and no new detergents. Spoke to amy and she advised he could use some vaseline to the area only.   When he picked up his digoxin he got a 250mg  dose instead of the 125 mg dose and instructions still say take 1 tablet daily. Tried calling the pharmacy but no answer. --got in touch with  pharmacy and that's what they have on file.  When I seen him for initial visit I had called to verify that dose and it was advised to only take 1 tablet of 17mcg of digoxin--confirmed with amy today to continue the 159mcg.   Marylouise Stacks, [facility] Paramedic  09/01/18

## 2018-09-09 ENCOUNTER — Ambulatory Visit (HOSPITAL_COMMUNITY)
Admission: RE | Admit: 2018-09-09 | Discharge: 2018-09-09 | Disposition: A | Discharge status: Home / Self Care | Payer: Self-pay | Source: Ambulatory Visit | Attending: Adult Health | Admitting: Adult Health

## 2018-09-09 ENCOUNTER — Other Ambulatory Visit: Payer: Self-pay

## 2018-09-09 ENCOUNTER — Telehealth (HOSPITAL_COMMUNITY): Payer: Self-pay | Admitting: Licensed Clinical Social Worker

## 2018-09-09 ENCOUNTER — Encounter (HOSPITAL_COMMUNITY): Payer: Self-pay

## 2018-09-09 ENCOUNTER — Other Ambulatory Visit (HOSPITAL_COMMUNITY): Payer: Self-pay

## 2018-09-09 VITALS — BP 114/86 | HR 78 | Temp 97.0°F | Wt 172.0 lb

## 2018-09-09 DIAGNOSIS — F1010 Alcohol abuse, uncomplicated: Secondary | ICD-10-CM

## 2018-09-09 DIAGNOSIS — I5022 Chronic systolic (congestive) heart failure: Secondary | ICD-10-CM

## 2018-09-09 DIAGNOSIS — Z720 Tobacco use: Secondary | ICD-10-CM

## 2018-09-09 DIAGNOSIS — I428 Other cardiomyopathies: Secondary | ICD-10-CM

## 2018-09-09 MED ORDER — CARVEDILOL 6.25 MG PO TABS: 6.2500 mg | ORAL_TABLET | Freq: Two times a day (BID) | ORAL | 5 refills | Status: DC

## 2018-09-09 MED FILL — CARVEDILOL 6.25 MG TABLET: 6.25 | 30 days supply | Qty: 60 | Fill #0

## 2018-09-09 NOTE — Addendum Note (Signed)
Encounter addended by: Valeda Malm, RN on: 09/09/2018 9:29 AM  Actions taken: Order list changed, Clinical Note Signed

## 2018-09-09 NOTE — Progress Notes (Signed)
Heart Failure TeleHealth Note  Due to national recommendations of social distancing due to COVID 19, Audio/video telehealth visit is felt to be most appropriate for this patient at this time.  See MyChart message from today for patient consent regarding telehealth for [facility].  Date:  09/09/2018   ID:  Bruce Morse, DOB 1990/04/09, MRN [id number]  Location: Home  Provider location: [place] [facility] Type of Visit: Established patient  PCP:  Patient, No Pcp Per  Cardiologist:  No primary care provider on file. Primary HF: Dr Gala Romney   Chief Complaint: Heart Failure   History of Present Illness: Bruce Morse is a 28 y.o. male with a history of chronic systolic CHF, NICM, "congenital heart disease", ETOH abuse, Tobacco abuse, and THC use/abuse. Extensive history at [facility] work up was for the most part negative. 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 EKG with tachycardia and LVH. Echo showed new diagnosis of CHF with EF 20-25%. Taken for cath as below which demonstrated NICM. HF meds adjusted as tolerated. He was not discharged on bb.  He presents via Web designer for a telehealth visit today.   Last visit coreg was started 3.125 mg twice a day. Overall feeling fine. Denies SOB/PND/Orthopnea. No issues with Life Vest.  Appetite ok. No fever or chills. Weight at home 172 pounds. Taking all medications. He denies alcohol use. Says he is smoking a little bit.   he denies symptoms worrisome for COVID 19.   Past Medical History:  Diagnosis Date  . Alcohol abuse   . Chronic systolic (congestive) heart failure (HCC)   . Congenital heart disease   . Marijuana abuse   .  Syncope    at age 5  . Tobacco abuse    Past Surgical History:  Procedure Laterality Date  . RIGHT/LEFT HEART CATH AND CORONARY ANGIOGRAPHY N/A 05/31/2018   Procedure: RIGHT/LEFT HEART CATH AND CORONARY ANGIOGRAPHY;  Surgeon: Dolores Patty, MD;  Location: [facility] INV[facility] LAB;  Service: Cardiovascular;  Laterality: N/A;     Current Outpatient Medications  Medication Sig Dispense Refill  . carvedilol (COREG) 3.125 MG tablet Take 1 tablet (3.125 mg total) by mouth 2 (two) times daily. 60 tablet 5  . digoxin (LANOXIN) 0.25 MG tablet Take 1 tablet (0.25 mg total) by mouth daily for 30 days. (Patient taking differently: Take 0.25 mg by mouth daily. ) 30 tablet 5  . sacubitril-valsartan (ENTRESTO) 24-26 MG Take 1 tablet by mouth 2 (two) times daily.    Marland Kitchen spironolactone (ALDACTONE) 25 MG tablet Take 0.5 tablets (12.5 mg total) by mouth daily for 30 days. 15 tablet 5  . furosemide (LASIX) 20 MG tablet Take 1 tablet (20 mg total) by mouth daily as needed for up to 30 days for fluid or edema (shortness of breath or leg swelling.). (Patient not taking: Reported on 09/09/2018) 30 tablet 5   No current facility-administered medications for this encounter.     Allergies:   Patient has no known allergies.   Social History:  The patient  reports that he has been smoking. He has never used smokeless tobacco. He reports current alcohol use.   Family History:  The patient's family history is not on file.  ROS:  Please see the history of present illness.   All other systems are personally reviewed and negative.  Vitals:   09/09/18 0901  BP: 114/86  Pulse: 78  Temp: (!) 97 F (36.1 C)  SpO2: 99%    Exam:  Video Health Call; Exam is visual.  General:  Speaks in full sentences. No resp difficulty. Lungs: Normal respiratory effort with conversation.  Abdomen: Non-distended per patient report Extremities: Pt denies edema. Neuro: Alert & oriented x 3.   Recent Labs: 05/28/2018: B Natriuretic  Peptide 1,232.5; TSH 3.393 05/30/2018: ALT 45 06/01/2018: Platelets 274 06/02/2018: Hemoglobin 16.0 06/10/2018: Magnesium 2.3 07/26/2018: BUN 13; Creatinine, Ser 1.20; Potassium 4.1; Sodium 137  Personally reviewed   Wt Readings from Last 3 Encounters:  09/09/18 78 kg (172 lb)  09/09/18 78 kg (172 lb)  09/01/18 80.7 kg (178 lb)      ASSESSMENT AND PLAN:  1. Chronic systolic heart failure, NICM - Normal coronaries by cath 3/9/2020on cath EF 15%. -Echo 05/28/2018 LVEF 20-25%, Mild LAE, Mod MR, Mod TR, Mild MR.  - Continue Life Vest. Plan to repeat ECHO in July. If EF < 35% will need to refer to EP.  -NYHA I. Volume status stable.  -Increase carvedilol to 6.25 mg twice a day.   -Continue Entresto 24/26 mg BID.he will be given samples today.  - Continue spironolactone 12.5 mg daily. -- Needs cMRI once he has insurance. Needs genetic counseling once he has insurance.  2. Congenital heart disease - Extensive work up at [facility] as above. No clear etiology - Will plan cMRI and genetic screeningonce insurance is in place.  3. H/o NSVT - Continue lifevest. - Repeat ECHO in July.  4. ETOH abuse -Congratulated on abstinence.   5. Tobacco abuse Discussed smoking cessation.   COVID screen The patient does not have any symptoms that suggest any further testing/ screening at this time.  Social distancing reinforced today.  Patient Risk: After full review of this patients clinical status, I feel that they are at moderate risk for cardiac decompensation at this time.  Relevant cardiac medications were reviewed at length with the patient today. The patient does not have concerns regarding their medications at this time.   The following changes were made today:  Increase carvedilol to 6.25 mg twice a day.  Recommended follow-up:  July with an ECHO.   Today, I have spent 15 minutes with the patient with telehealth technology discussing the above issues .    Jeanmarie Hubert, NP  09/09/2018 9:09 AM  [place] [address] [facility] and [place] [address] [phone] (office) [phone] (fax)

## 2018-09-09 NOTE — Patient Instructions (Signed)
DISCONTINUE Coreg 3.125 mg .   START Coreg 6.25 mg (1 tab) twice a day.   Your are scheduled for a follow-up appointment on October 11, 2018 10a for Echo and 11am with Dr Haroldine Laws

## 2018-09-09 NOTE — Progress Notes (Signed)
Paramedicine Encounter    Patient ID: Vishruth Seoane, male    DOB: [birth year], 28 y.o.   MRN: [id number]   Patient Care Team: Patient, No Pcp Per as PCP - General (General Practice)  Patient Active Problem List   Diagnosis Date Noted  . Chronic systolic congestive heart failure ([place]) 06/10/2018  . NICM (nonischemic cardiomyopathy) ([place]) 06/10/2018  . ETOH abuse 06/10/2018  . Tobacco abuse 06/10/2018  . Mild tetrahydrocannabinol (THC) abuse 06/10/2018  . AKI (acute kidney injury) ([place]) 05/29/2018  . Congenital heart disease 05/29/2018  . NSVT (nonsustained ventricular tachycardia) ([place]) 05/29/2018  . Hypoxemia   . Alcoholic cardiomyopathy ([place])   . Alcohol use   . Acute systolic CHF (congestive heart failure) ([place]) 05/28/2018  . Left lower quadrant abdominal pain 05/28/2018  . Acute pulmonary edema (HCC) 05/28/2018    Current Outpatient Medications:  .  carvedilol (COREG) 3.125 MG tablet, Take 1 tablet (3.125 mg total) by mouth 2 (two) times daily., Disp: 60 tablet, Rfl: 5 .  digoxin (LANOXIN) 0.25 MG tablet, Take 1 tablet (0.25 mg total) by mouth daily for 30 days. (Patient taking differently: Take 0.25 mg by mouth daily. ), Disp: 30 tablet, Rfl: 5 .  sacubitril-valsartan (ENTRESTO) 24-26 MG, Take 1 tablet by mouth 2 (two) times daily., Disp: , Rfl:  .  spironolactone (ALDACTONE) 25 MG tablet, Take 0.5 tablets (12.5 mg total) by mouth daily for 30 days., Disp: 15 tablet, Rfl: 5 .  furosemide (LASIX) 20 MG tablet, Take 1 tablet (20 mg total) by mouth daily as needed for up to 30 days for fluid or edema (shortness of breath or leg swelling.). (Patient not taking: Reported on 09/09/2018), Disp: 30 tablet, Rfl: 5 No Known Allergies    Social History   Socioeconomic History  . Marital status: Single    Spouse name: Not on file  . Number of children: Not on file  . Years of education: Not on file  . Highest education level: Not on file  Occupational History  . Not on file   Social Needs  . Financial resource strain: Not on file  . Food insecurity    Worry: Not on file    Inability: Not on file  . Transportation needs    Medical: Not on file    Non-medical: Not on file  Tobacco Use  . Smoking status: Current Every Day Smoker  . Smokeless tobacco: Never Used  Substance and Sexual Activity  . Alcohol use: Yes    Comment: Daily.  . Drug use: Not on file  . Sexual activity: Not on file  Lifestyle  . Physical activity    Days per week: Not on file    Minutes per session: Not on file  . Stress: Not on file  Relationships  . Social Herbalist on phone: Not on file    Gets together: Not on file    Attends religious service: Not on file    Active member of club or organization: Not on file    Attends meetings of clubs or organizations: Not on file    Relationship status: Not on file  . Intimate partner violence    Fear of current or ex partner: Not on file    Emotionally abused: Not on file    Physically abused: Not on file    Forced sexual activity: Not on file  Other Topics Concern  . Not on file  Social History Narrative  . Not on file  Physical Exam      Future Appointments  Date Time Provider Department Center  10/11/2018 10:00 AM [facility] ECHO OP 1 [facility]  10/11/2018 11:00 AM Bensimhon, Bevelyn Buckles, MD [facility] None    BP 114/86   Pulse 78   Temp (!) 97 F (36.1 C)   Resp 16   Wt 172 lb (78 kg)   SpO2 99%   BMI 26.15 kg/m   Weight yesterday-175 Last visit weight-178  Pt reports feeling great, the rash is clearing up but sometimes the life vest triggers that it is not making good contact when he uses the lotion on his skin so he then just wipes his skin off and resets the device and then its fine.  He reports weight ranging from 172-175 up and down and depending on what he eats. When his weight goes up he feels great he doesn't have any sob, no dizziness, no c/p. No swelling noted.  Virtual visit with amy today.   His carvedilol is being increased to 6.25mg  BID. He will start that tonight with his next dose.  Contacted Jenna to see if [facility] is accepting new patients as he needs PCP care.    Kerry Hough, EMT-Paramedic [phone] [facility] Paramedic  09/09/18

## 2018-09-09 NOTE — Telephone Encounter (Signed)
CSW informed by [employer] that pt is need of PCP to address non-heart failure related medical concerns.  Pt set up with appt at [place] for Establish Care appt on 7/1.  CSW provided pt with the details and he expresses no concerns with attended that appt.  CSW will continue to follow and assist as needed  Jorge Ny, [facility] Desk#: [phone] Cell#: [phone]

## 2018-09-09 NOTE — Progress Notes (Addendum)
Called patient to review AVS.  Left detailed message with AVS instructions below.  Number to office left on vm should he have any questions. Information sent to patients mychart  DISCONTINUE Coreg 3.125 mg .   START Coreg 6.25 mg (1 tab) twice a day.   Your are scheduled for a follow-up appointment on October 11, 2018 10a for Echo and 11am with Dr Haroldine Laws

## 2018-09-16 ENCOUNTER — Other Ambulatory Visit (HOSPITAL_COMMUNITY): Payer: Self-pay

## 2018-09-16 NOTE — Progress Notes (Signed)
Paramedicine Encounter    Patient ID: Bruce Morse, male    DOB: 10-18-1990, 28 y.o.   MRN: [id number]   Patient Care Team: Patient, No Pcp Per as PCP - General (General Practice)  Patient Active Problem List   Diagnosis Date Noted  . Chronic systolic congestive heart failure (HCC) 06/10/2018  . NICM (nonischemic cardiomyopathy) (HCC) 06/10/2018  . ETOH abuse 06/10/2018  . Tobacco abuse 06/10/2018  . Mild tetrahydrocannabinol (THC) abuse 06/10/2018  . AKI (acute kidney injury) (HCC) 05/29/2018  . Congenital heart disease 05/29/2018  . NSVT (nonsustained ventricular tachycardia) (HCC) 05/29/2018  . Hypoxemia   . Alcoholic cardiomyopathy (HCC)   . Alcohol use   . Acute systolic CHF (congestive heart failure) (HCC) 05/28/2018  . Left lower quadrant abdominal pain 05/28/2018  . Acute pulmonary edema (HCC) 05/28/2018    Current Outpatient Medications:  .  carvedilol (COREG) 6.25 MG tablet, Take 1 tablet (6.25 mg total) by mouth 2 (two) times daily., Disp: 60 tablet, Rfl: 5 .  digoxin (LANOXIN) 0.25 MG tablet, Take 1 tablet (0.25 mg total) by mouth daily for 30 days. (Patient taking differently: Take 0.25 mg by mouth daily. ), Disp: 30 tablet, Rfl: 5 .  furosemide (LASIX) 20 MG tablet, Take 1 tablet (20 mg total) by mouth daily as needed for up to 30 days for fluid or edema (shortness of breath or leg swelling.). (Patient not taking: Reported on 09/09/2018), Disp: 30 tablet, Rfl: 5 .  sacubitril-valsartan (ENTRESTO) 24-26 MG, Take 1 tablet by mouth 2 (two) times daily., Disp: , Rfl:  .  spironolactone (ALDACTONE) 25 MG tablet, Take 0.5 tablets (12.5 mg total) by mouth daily for 30 days., Disp: 15 tablet, Rfl: 5 No Known Allergies    Social History   Socioeconomic History  . Marital status: Single    Spouse name: Not on file  . Number of children: Not on file  . Years of education: Not on file  . Highest education level: Not on file  Occupational History  . Not on file   Social Needs  . Financial resource strain: Not on file  . Food insecurity    Worry: Not on file    Inability: Not on file  . Transportation needs    Medical: Not on file    Non-medical: Not on file  Tobacco Use  . Smoking status: Current Every Day Smoker  . Smokeless tobacco: Never Used  Substance and Sexual Activity  . Alcohol use: Yes    Comment: Daily.  . Drug use: Not on file  . Sexual activity: Not on file  Lifestyle  . Physical activity    Days per week: Not on file    Minutes per session: Not on file  . Stress: Not on file  Relationships  . Social Musician on phone: Not on file    Gets together: Not on file    Attends religious service: Not on file    Active member of club or organization: Not on file    Attends meetings of clubs or organizations: Not on file    Relationship status: Not on file  . Intimate partner violence    Fear of current or ex partner: Not on file    Emotionally abused: Not on file    Physically abused: Not on file    Forced sexual activity: Not on file  Other Topics Concern  . Not on file  Social History Narrative  . Not on file  Physical Exam      Future Appointments  Date Time Provider [place]  09/22/2018 10:30 AM [name], NP [facility] None  10/11/2018 10:00 AM MC ECHO OP 1 [facility]  10/11/2018 11:00 AM Bensimhon, Shaune Pascal, MD [facility] None    BP 110/72   Pulse 80   Temp (!) 97.2 F (36.2 C)   Resp 16   Wt 175 lb (79.4 kg)   SpO2 98%   BMI 26.61 kg/m   Weight yesterday-172 Last visit weight-172  Pt reports he is doing ok, he denies any complaints, no c/p, no dizziness, no issues since his carvedilol increase last week. No sob.  Pts lifevest has been alerting him for lack of contact--it did it several times while I was there-contacted clinic to get the number to call for assistance. He does not have any lotion or barrier creams on from that rash he had. No edema noted. Will f/u in a  couple wks.   Marylouise Stacks, [place] [facility] Paramedic  09/16/18

## 2018-09-22 ENCOUNTER — Other Ambulatory Visit: Payer: Self-pay

## 2018-09-22 ENCOUNTER — Telehealth (INDEPENDENT_AMBULATORY_CARE_PROVIDER_SITE_OTHER): Payer: Self-pay | Admitting: Primary Care

## 2018-09-22 DIAGNOSIS — I5022 Chronic systolic (congestive) heart failure: Secondary | ICD-10-CM

## 2018-09-22 DIAGNOSIS — A599 Trichomoniasis, unspecified: Secondary | ICD-10-CM

## 2018-09-22 DIAGNOSIS — Z7689 Persons encountering health services in other specified circumstances: Secondary | ICD-10-CM

## 2018-09-22 MED ORDER — METRONIDAZOLE 500 MG PO TABS: 500.0000 mg | ORAL_TABLET | Freq: Once | ORAL | 0 refills | Status: AC

## 2018-09-22 MED FILL — metroNIDAZOLE 500 MG TABS: 500 | 1 days supply | Qty: 4 | Fill #0

## 2018-09-22 NOTE — Progress Notes (Signed)
Virtual Visit via Telephone Note  I connected with Bruce Morse on 09/22/18 at 10:30 AM EDT by telephone and verified that I am speaking with the correct person using two identifiers.   I discussed the limitations, risks, security and privacy concerns of performing an evaluation and management service by telephone and the availability of in person appointments. I also discussed with the patient that there may be a patient responsible charge related to this service. The patient expressed understanding and agreed to proceed.   He presents today to establish in the   History of Present Illness: Bruce Morse presents today to establish care and treatment for trichomoniasis  . He states his girlfriend which they live together. She went for her gyn appointment and was given the diagnosis of trichomoniasis and was advised her partner needed to be treated also. Patient has history of congenital heart disease and orthopnea CHF EF 20-25% followed by [facility] by Dr.Daniel Bensimhon.    Observations/Objective: Review of Systems  Respiratory: Positive for shortness of breath.   Cardiovascular: Positive for leg swelling.  All other systems reviewed and are negative.   Assessment and Plan: Diagnoses and all orders for this visit:  Chronic systolic congestive heart failure ([place]) Followed by heart care Dr. Haroldine Laws for chronic systolic congestive heart failure congenital heart disease.  EF 20 to 25%.  All cardiac medications will be refilled by the cardiologist.  Trichomoniasis Girlfriend is positive for trichomonas information learned at her doctor's appointment patient states she was treated and they live together and he would like to also be treated.  Collected a urine cytology treat prophylactically with Flagyl 2000 mg once.  Encounter to establish care No primary care on file unable to get an appointment with the health department needing and requesting treatment  Other orders -      metroNIDAZOLE (FLAGYL) 500 MG tablet; Take 1 tablet (500 mg total) by mouth once for 1 dose.    Follow Up Instructions:    I discussed the assessment and treatment plan with the patient. The patient was provided an opportunity to ask questions and all were answered. The patient agreed with the plan and demonstrated an understanding of the instructions.   The patient was advised to call back or seek an in-person evaluation if the symptoms worsen or if the condition fails to improve as anticipated.  I provided 20 minutes of non-face-to-face time during this encounter.  This includes reading previous encounters, labs and imaging.   [name], NP

## 2018-09-30 ENCOUNTER — Other Ambulatory Visit (HOSPITAL_COMMUNITY): Payer: Self-pay

## 2018-09-30 MED FILL — SPIRONOLACTONE 25 MG TABLET: 25 | 30 days supply | Qty: 15 | Fill #1

## 2018-09-30 NOTE — Progress Notes (Signed)
Paramedicine Encounter    Patient ID: Bruce Morse, male    DOB: [birth year], 28 y.o.   MRN: [id number]   Patient Care Team: Grayce Sessions, NP as PCP - General (Internal Medicine)  Patient Active Problem List   Diagnosis Date Noted  . Chronic systolic congestive heart failure (HCC) 06/10/2018  . NICM (nonischemic cardiomyopathy) (HCC) 06/10/2018  . ETOH abuse 06/10/2018  . Tobacco abuse 06/10/2018  . Mild tetrahydrocannabinol (THC) abuse 06/10/2018  . AKI (acute kidney injury) (HCC) 05/29/2018  . Congenital heart disease 05/29/2018  . NSVT (nonsustained ventricular tachycardia) (HCC) 05/29/2018  . Hypoxemia   . Alcoholic cardiomyopathy (HCC)   . Alcohol use   . Acute systolic CHF (congestive heart failure) (HCC) 05/28/2018  . Left lower quadrant abdominal pain 05/28/2018  . Acute pulmonary edema (HCC) 05/28/2018    Current Outpatient Medications:  .  carvedilol (COREG) 6.25 MG tablet, Take 1 tablet (6.25 mg total) by mouth 2 (two) times daily., Disp: 60 tablet, Rfl: 5 .  sacubitril-valsartan (ENTRESTO) 24-26 MG, Take 1 tablet by mouth 2 (two) times daily., Disp: , Rfl:  .  digoxin (LANOXIN) 0.25 MG tablet, Take 1 tablet (0.25 mg total) by mouth daily for 30 days. (Patient taking differently: Take 0.25 mg by mouth daily. ), Disp: 30 tablet, Rfl: 5 .  furosemide (LASIX) 20 MG tablet, Take 1 tablet (20 mg total) by mouth daily as needed for up to 30 days for fluid or edema (shortness of breath or leg swelling.). (Patient not taking: Reported on 09/09/2018), Disp: 30 tablet, Rfl: 5 .  spironolactone (ALDACTONE) 25 MG tablet, Take 0.5 tablets (12.5 mg total) by mouth daily for 30 days., Disp: 15 tablet, Rfl: 5 No Known Allergies    Social History   Socioeconomic History  . Marital status: Single    Spouse name: Not on file  . Number of children: Not on file  . Years of education: Not on file  . Highest education level: Not on file  Occupational History  . Not on file   Social Needs  . Financial resource strain: Not on file  . Food insecurity    Worry: Not on file    Inability: Not on file  . Transportation needs    Medical: Not on file    Non-medical: Not on file  Tobacco Use  . Smoking status: Current Every Day Smoker  . Smokeless tobacco: Never Used  Substance and Sexual Activity  . Alcohol use: Yes    Comment: Daily.  . Drug use: Not on file  . Sexual activity: Not on file  Lifestyle  . Physical activity    Days per week: Not on file    Minutes per session: Not on file  . Stress: Not on file  Relationships  . Social Musician on phone: Not on file    Gets together: Not on file    Attends religious service: Not on file    Active member of club or organization: Not on file    Attends meetings of clubs or organizations: Not on file    Relationship status: Not on file  . Intimate partner violence    Fear of current or ex partner: Not on file    Emotionally abused: Not on file    Physically abused: Not on file    Forced sexual activity: Not on file  Other Topics Concern  . Not on file  Social History Narrative  . Not on file  Physical Exam      Future Appointments  Date Time Provider [place]  10/11/2018 10:00 AM MC ECHO OP 1 MC[facility]ECHOLAB [facility]  10/11/2018 11:00 AM Bensimhon, Shaune Pascal, MD [facility] None    BP 126/84   Pulse 90   Temp 97.9 F (36.6 C)   Resp 16   Wt 178 lb (80.7 kg)   SpO2 98%   BMI 27.06 kg/m   Weight yesterday-180 Last visit weight-175  Pt reports he is doing good, he denies sob, no dizziness, no c/p,  His fluid intake has increased to the heat and hot temps outside, he has increased urine output, weight maintaining pretty good, sometimes it gets up to 182-he denies feeling any worse or sob or any s/s during this time. Diet sometimes has higher amounts of sodium.  Called in spiro for refill.  Will f/u at next clinic appointment.    Marylouise Stacks,  [place] [facility] Paramedic  09/30/18

## 2018-10-11 ENCOUNTER — Other Ambulatory Visit (HOSPITAL_COMMUNITY): Payer: Self-pay

## 2018-10-11 ENCOUNTER — Telehealth (HOSPITAL_COMMUNITY): Payer: Self-pay | Admitting: Licensed Clinical Social Worker

## 2018-10-11 ENCOUNTER — Encounter (HOSPITAL_COMMUNITY): Payer: Self-pay | Admitting: Internal Medicine

## 2018-10-11 ENCOUNTER — Other Ambulatory Visit: Payer: Self-pay

## 2018-10-11 ENCOUNTER — Ambulatory Visit (HOSPITAL_BASED_OUTPATIENT_CLINIC_OR_DEPARTMENT_OTHER)
Admission: RE | Admit: 2018-10-11 | Discharge: 2018-10-11 | Disposition: A | Discharge status: Home / Self Care | Payer: Self-pay | Source: Ambulatory Visit | Attending: Internal Medicine | Admitting: Internal Medicine

## 2018-10-11 ENCOUNTER — Ambulatory Visit (HOSPITAL_COMMUNITY)
Admission: RE | Admit: 2018-10-11 | Discharge: 2018-10-11 | Disposition: A | Discharge status: Home / Self Care | Payer: Self-pay | Source: Ambulatory Visit | Attending: Internal Medicine | Admitting: Internal Medicine

## 2018-10-11 VITALS — BP 126/80 | HR 80 | Wt 190.4 lb

## 2018-10-11 DIAGNOSIS — Z720 Tobacco use: Secondary | ICD-10-CM

## 2018-10-11 DIAGNOSIS — I428 Other cardiomyopathies: Secondary | ICD-10-CM | POA: Insufficient documentation

## 2018-10-11 DIAGNOSIS — I472 Ventricular tachycardia: Secondary | ICD-10-CM | POA: Insufficient documentation

## 2018-10-11 DIAGNOSIS — Z79899 Other long term (current) drug therapy: Secondary | ICD-10-CM | POA: Insufficient documentation

## 2018-10-11 DIAGNOSIS — I5022 Chronic systolic (congestive) heart failure: Secondary | ICD-10-CM

## 2018-10-11 DIAGNOSIS — F17200 Nicotine dependence, unspecified, uncomplicated: Secondary | ICD-10-CM | POA: Insufficient documentation

## 2018-10-11 DIAGNOSIS — Z7901 Long term (current) use of anticoagulants: Secondary | ICD-10-CM | POA: Insufficient documentation

## 2018-10-11 LAB — BRAIN NATRIURETIC PEPTIDE: B Natriuretic Peptide: 198.8 pg/mL — ABNORMAL HIGH (ref 0.0–100.0)

## 2018-10-11 LAB — BASIC METABOLIC PANEL
Anion gap: 9 (ref 5–15)
BUN: 6 mg/dL (ref 6–20)
CO2: 21 mmol/L — ABNORMAL LOW (ref 22–32)
Calcium: 9.5 mg/dL (ref 8.9–10.3)
Chloride: 107 mmol/L (ref 98–111)
Creatinine, Ser: 1.07 mg/dL (ref 0.61–1.24)
GFR calc Af Amer: >60 mL/min (ref >60)
GFR calc non Af Amer: >60 mL/min (ref >60)
Glucose, Bld: 97 mg/dL (ref 70–99)
Potassium: 4.5 mmol/L (ref 3.5–5.1)
Sodium: 137 mmol/L (ref 135–145)

## 2018-10-11 LAB — MAGNESIUM: Magnesium: 2.2 mg/dL (ref 1.7–2.4)

## 2018-10-11 MED ORDER — SACUBITRIL-VALSARTAN 49-51 MG PO TABS: 1.0000 | ORAL_TABLET | Freq: Two times a day (BID) | ORAL | 6 refills | Status: DC

## 2018-10-11 NOTE — Progress Notes (Signed)
Paramedicine Encounter   Patient ID: Bruce Morse , male,   DOB: [birth year],27 y.o.,  MRN: [id number]   Met patient in clinic today with provider.   B/p-126/80 p-80 Weight @ clinic-190 sp02-99  Pt reports feeling good today. He had ECHO done today. He is anxious waiting on the results-he is ready to get rid of the life vest.   He denies sob, he denies dizziness, no c/p. No complaints.  His weight is up 12lbs from his last clinic visit. Appetite good.  No edema. He still having pain to his ankle where he twisted it a few wks ago.  No missed doses of his meds.  His ECHO showed improvement to 35-40%.  He can stop the digoxin.  entresto is being increased to 49-51.  He needs cardiac MRI but still pending on the disability for insurance.  Will get jenna to assist to follow up.  Once he gets insurance then we can proceed with the MRI.    Marylouise Stacks, EMT-Paramedic [phone] 10/11/2018   ACTION: Home visit completed

## 2018-10-11 NOTE — Progress Notes (Signed)
Placed call to Bruce Morse in order to d/c LifeVest.

## 2018-10-11 NOTE — Addendum Note (Signed)
Encounter addended by: Marlise Eves, RN on: 10/11/2018 11:40 AM  Actions taken: Diagnosis association updated, Pharmacy for encounter modified, Order list changed, Medication long-term status modified, Charge Capture section accepted, Clinical Note Signed

## 2018-10-11 NOTE — Progress Notes (Signed)
Advanced HF Clinic Note  Date:  10/11/2018   ID:  Bruce Morse, DOB 1990/07/09, MRN [id number]  Location: Home  Provider location: [place] [facility] Failure Type of Visit: Established patient  PCP:  Bruce Perna, NP  Cardiologist:  No primary care provider on file. Primary HF: Dr Haroldine Laws   Chief Complaint: Heart Failure   History of Present Illness: Bruce Morse is a 28 y.o. male with a history of chronic systolic CHF, NICM, "congenital heart disease", ETOH abuse, Tobacco abuse, and THC use/abuse. Extensive history at [facility] work up was for the most part negative. 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 EKG with tachycardia and LVH. Echo showed new diagnosis of CHF with EF 20-25%. Taken for cath as below which demonstrated NICM. HF meds adjusted as tolerated. He was not discharged on bb.  He is here for routine f/u with Paramedicine team.  Last visit coreg was increased to 6.25 mg twice a day. Overall feeling very good. Not very active. Walks daughter around the parking lot in the stroller. Otherwise not that active. No SOB, orthopnea, PND or edema. Wearing LifeVest. SBP 110-120s. No dizziness.   Echo today 35-40% Personally reviewed   Past Medical History:  Diagnosis Date  . Alcohol abuse   . Chronic systolic (congestive) heart failure ([place])   . Congenital heart disease   . Marijuana abuse   . Syncope    at age 20  . Tobacco abuse    Past Surgical History:  Procedure Laterality Date  . RIGHT/LEFT HEART CATH AND CORONARY ANGIOGRAPHY N/A 05/31/2018   Procedure: RIGHT/LEFT HEART CATH AND CORONARY ANGIOGRAPHY;  Surgeon: Jolaine Artist, MD;  Location: [place] [facility];   Service: Cardiovascular;  Laterality: N/A;     Current Outpatient Medications  Medication Sig Dispense Refill  . carvedilol (COREG) 6.25 MG tablet Take 1 tablet (6.25 mg total) by mouth 2 (two) times daily. 60 tablet 5  . digoxin (LANOXIN) 0.125 MG tablet Take 0.125 mg by mouth daily.    . sacubitril-valsartan (ENTRESTO) 24-26 MG Take 1 tablet by mouth 2 (two) times daily.    Marland Kitchen spironolactone (ALDACTONE) 25 MG tablet Take 25 mg by mouth daily.    . digoxin (LANOXIN) 0.25 MG tablet Take 1 tablet (0.25 mg total) by mouth daily for 30 days. (Patient taking differently: Take 0.25 mg by mouth daily. ) 30 tablet 5  . digoxin (LANOXIN) 0.25 MG tablet Take 0.25 mg by mouth daily.    . furosemide (LASIX) 20 MG tablet Take 1 tablet (20 mg total) by mouth daily as needed for up to 30 days for fluid or edema (shortness of breath or leg swelling.). (Patient not taking: Reported on 09/09/2018) 30 tablet 5  . spironolactone (ALDACTONE) 25 MG tablet Take 0.5 tablets (12.5 mg total) by mouth daily for 30 days. 15 tablet 5   No current facility-administered medications for this encounter.     Allergies:   Patient has no known allergies.   Social History:  The patient  reports that he has been smoking. He has never used smokeless tobacco. He reports current alcohol use.   Family History:  The patient's family history is not on file.   ROS:  Please see the history of present illness.   All other systems are personally reviewed and negative.  Vitals:   10/11/18 1050  BP: 126/80  Pulse: 80  SpO2: 99%    General:  Well appearing. No resp difficulty HEENT: normal Neck: supple. no JVD. Carotids 2+ bilat; no bruits. No lymphadenopathy or thryomegaly appreciated. Cor: PMI nondisplaced. Regular rate & rhythm. No rubs, gallops or murmurs. Lungs: clear Abdomen: soft, nontender, nondistended. No hepatosplenomegaly. No bruits or masses. Good bowel sounds. Extremities: no cyanosis, clubbing, rash, edema Neuro:  alert & orientedx3, cranial nerves grossly intact. moves all 4 extremities w/o difficulty. Affect pleasant  Recent Labs: 05/28/2018: B Natriuretic Peptide 1,232.5; TSH 3.393 05/30/2018: ALT 45 06/01/2018: Platelets 274 06/02/2018: Hemoglobin 16.0 06/10/2018: Magnesium 2.3 07/26/2018: BUN 13; Creatinine, Ser 1.20; Potassium 4.1; Sodium 137  Personally reviewed   Wt Readings from Last 3 Encounters:  10/11/18 86.4 kg (190 lb 6.4 oz)  09/30/18 80.7 kg (178 lb)  09/16/18 79.4 kg (175 lb)      ASSESSMENT AND PLAN:  1. Chronic systolic heart failure, NICM - Normal coronaries by cath 3/9/2020on cath EF 15%. -Echo 05/28/2018 LVEF 20-25%, Mild LAE, Mod MR, Mod TR, Mild MR.  - Echo to ay 10/11/18 EF 35-40% RV normal  - NYHA I. Volume status stable. Off lasix.  - Continue carvedilol 6.25 mg twice a day.   -Increase Entresto to 49/51 mg BID. - Continue spironolactone 12.5 mg daily. - With improved EF can stop digoxin - Needs cMRI once he has insurance +/- genetic counseling  - BMET & BNP  2. Congenital heart disease - Extensive work up at [facility] as above. No clear etiology - Will plan cMRI +/- genetic screening  3. H/o NSVT - Continue lifevest. - EF > 35% can remove LifeVest  4. ETOH abuse -Congratulated on abstinence.   5. Tobacco abuse Discussed smoking cessation.   Signed, Arvilla Meres, MD  10/11/2018 11:20 AM  [facility] [address] [facility] [place] [address] [phone] (office) [phone] (fax)

## 2018-10-11 NOTE — Telephone Encounter (Signed)
CSW informed by [employer] that pt needs to get cardiac MRI but he currently doesn't have coverage.  Patient had pending Medicaid case that was filed during recent hospital stay.  CSW spoke with case worker who confirms that pt has been denied for ongoing Medicaid coverage- unable to share why with CSW as I am not listed as an authorized representative.    Pt reports that he had a child born in April which is around the time the application was being filed and he doesn't think that the caseworker was aware of this- CSW encouraged him to call the case worker to inquire if he might now be eligible for Medicaid due to the birth of his child.  CSW also discussed [employer] with pt and mailed application.  Explained that this would cover cone bills based off of income.  CSW will continue to follow and assist as needed  Bruce Morse, [place] [facility] Desk#: [phone] Cell#: [phone]

## 2018-10-11 NOTE — Patient Instructions (Signed)
Lab work done today. We will notify you of any abnormal lab work. No news is good news.  STOP Diogoxin  STOP LifeVest  INCREASE Entresto to 49/51mg . Take one tab two times daily.  Please follow up with the [facility] in 2 months.  At the [facility], you and your health needs are our priority. As part of our continuing mission to provide you with exceptional heart care, we have created designated Provider Care Teams. These Care Teams include your primary Cardiologist (physician) and Advanced Practice Providers (APPs- Physician Assistants and Nurse Practitioners) who all work together to provide you with the care you need, when you need it.   You may see any of the following providers on your designated Care Team at your next follow up: Marland Kitchen Dr Glori Bickers . Dr Loralie Champagne . Darrick Grinder, NP

## 2018-10-11 NOTE — Progress Notes (Signed)
  Echocardiogram 2D Echocardiogram has been performed.  Bruce Morse 10/11/2018, 10:50 AM

## 2018-10-18 ENCOUNTER — Other Ambulatory Visit (HOSPITAL_COMMUNITY): Payer: Self-pay

## 2018-10-18 MED FILL — CARVEDILOL 6.25 MG TABLET: 6.25 | 30 days supply | Qty: 60 | Fill #1

## 2018-10-18 NOTE — Progress Notes (Signed)
Paramedicine Encounter    Patient ID: Bruce Morse, male    DOB: 05/07/1990, 28 y.o.   MRN: [id number]   Patient Care Team: [name], NP as PCP - General (Internal Medicine)  Patient Active Problem List   Diagnosis Date Noted  . Chronic systolic congestive heart failure ([place]) 06/10/2018  . NICM (nonischemic cardiomyopathy) ([place]) 06/10/2018  . ETOH abuse 06/10/2018  . Tobacco abuse 06/10/2018  . Mild tetrahydrocannabinol (THC) abuse 06/10/2018  . AKI (acute kidney injury) ([place]) 05/29/2018  . Congenital heart disease 05/29/2018  . NSVT (nonsustained ventricular tachycardia) ([place]) 05/29/2018  . Hypoxemia   . Alcoholic cardiomyopathy ([place])   . Alcohol use   . Acute systolic CHF (congestive heart failure) ([place]) 05/28/2018  . Left lower quadrant abdominal pain 05/28/2018  . Acute pulmonary edema (HCC) 05/28/2018    Current Outpatient Medications:  .  carvedilol (COREG) 6.25 MG tablet, Take 1 tablet (6.25 mg total) by mouth 2 (two) times daily., Disp: 60 tablet, Rfl: 5 .  sacubitril-valsartan (ENTRESTO) 49-51 MG, Take 1 tablet by mouth 2 (two) times daily., Disp: 60 tablet, Rfl: 6 .  furosemide (LASIX) 20 MG tablet, Take 1 tablet (20 mg total) by mouth daily as needed for up to 30 days for fluid or edema (shortness of breath or leg swelling.). (Patient not taking: Reported on 09/09/2018), Disp: 30 tablet, Rfl: 5 .  spironolactone (ALDACTONE) 25 MG tablet, Take 0.5 tablets (12.5 mg total) by mouth daily for 30 days., Disp: 15 tablet, Rfl: 5 .  spironolactone (ALDACTONE) 25 MG tablet, Take 25 mg by mouth daily., Disp: , Rfl:  No Known Allergies    Social History   Socioeconomic History  . Marital status: Single    Spouse name: Not on file  . Number of children: Not on file  . Years of education: Not on file  . Highest education level: Not on file  Occupational History  . Not on file  Social Needs  . Financial resource strain: Not on file  . Food insecurity   Worry: Not on file    Inability: Not on file  . Transportation needs    Medical: Not on file    Non-medical: Not on file  Tobacco Use  . Smoking status: Current Every Day Smoker  . Smokeless tobacco: Never Used  Substance and Sexual Activity  . Alcohol use: Yes    Comment: Daily.  . Drug use: Not on file  . Sexual activity: Not on file  Lifestyle  . Physical activity    Days per week: Not on file    Minutes per session: Not on file  . Stress: Not on file  Relationships  . Social Herbalist on phone: Not on file    Gets together: Not on file    Attends religious service: Not on file    Active member of club or organization: Not on file    Attends meetings of clubs or organizations: Not on file    Relationship status: Not on file  . Intimate partner violence    Fear of current or ex partner: Not on file    Emotionally abused: Not on file    Physically abused: Not on file    Forced sexual activity: Not on file  Other Topics Concern  . Not on file  Social History Narrative  . Not on file    Physical Exam      Future Appointments  Date Time Provider [place]  12/13/2018  11:40 AM Bensimhon, Bevelyn Buckles, MD [facility] None    BP (!) 146/100   Pulse 92   Temp 98.6 F (37 C)   Resp 16   Wt 184 lb (83.5 kg)   SpO2 98%   BMI 27.98 kg/m    Last visit weight-190 @ clinic   Pt reports good, he ran out of his carvedilol and entresto X 3 days. I called entresto and they will send out this week.  Pt denies sob, no dizziness, he did start having h/a after he started the higher dose of entresto but after a few days that stopped. No edema noted. Lungs clear. B/p elevated but has been without meds for 3 days.  I advised that I would like to see him next week for b/p check after he is on his meds consistently, pt advised he is going out of town in a few days to get his kids. I told him to let me know when he returns and I will come do home visit.    Kerry Hough, EMT-Paramedic [phone] [facility] Paramedic  10/18/18

## 2018-11-03 ENCOUNTER — Telehealth (HOSPITAL_COMMUNITY): Payer: Self-pay | Admitting: Licensed Clinical Social Worker

## 2018-11-03 ENCOUNTER — Other Ambulatory Visit (HOSPITAL_COMMUNITY): Payer: Self-pay

## 2018-11-03 NOTE — Progress Notes (Signed)
Paramedicine Encounter    Patient ID: Bruce Morse, male    DOB: July 03, 1990, 28 y.o.   MRN: [id number]   Patient Care Team: [name], NP as PCP - General (Internal Medicine)  Patient Active Problem List   Diagnosis Date Noted  . Chronic systolic congestive heart failure ([place]) 06/10/2018  . NICM (nonischemic cardiomyopathy) ([place]) 06/10/2018  . ETOH abuse 06/10/2018  . Tobacco abuse 06/10/2018  . Mild tetrahydrocannabinol (THC) abuse 06/10/2018  . AKI (acute kidney injury) ([place]) 05/29/2018  . Congenital heart disease 05/29/2018  . NSVT (nonsustained ventricular tachycardia) ([place]) 05/29/2018  . Hypoxemia   . Alcoholic cardiomyopathy ([place])   . Alcohol use   . Acute systolic CHF (congestive heart failure) ([place]) 05/28/2018  . Left lower quadrant abdominal pain 05/28/2018  . Acute pulmonary edema ([facility]) 05/28/2018    Current Outpatient Medications:  .  carvedilol (COREG) 6.25 MG tablet, Take 1 tablet (6.25 mg total) by mouth 2 (two) times daily., Disp: 60 tablet, Rfl: 5 .  sacubitril-valsartan (ENTRESTO) 49-51 MG, Take 1 tablet by mouth 2 (two) times daily., Disp: 60 tablet, Rfl: 6 .  furosemide (LASIX) 20 MG tablet, Take 1 tablet (20 mg total) by mouth daily as needed for up to 30 days for fluid or edema (shortness of breath or leg swelling.). (Patient not taking: Reported on 09/09/2018), Disp: 30 tablet, Rfl: 5 .  spironolactone (ALDACTONE) 25 MG tablet, Take 0.5 tablets (12.5 mg total) by mouth daily for 30 days., Disp: 15 tablet, Rfl: 5 .  spironolactone (ALDACTONE) 25 MG tablet, Take 12.5 mg by mouth daily. , Disp: , Rfl:  No Known Allergies    Social History   Socioeconomic History  . Marital status: Single    Spouse name: Not on file  . Number of children: Not on file  . Years of education: Not on file  . Highest education level: Not on file  Occupational History  . Not on file  Social Needs  . Financial resource strain: Not on file  . Food insecurity   Worry: Not on file    Inability: Not on file  . Transportation needs    Medical: Not on file    Non-medical: Not on file  Tobacco Use  . Smoking status: Current Every Day Smoker  . Smokeless tobacco: Never Used  Substance and Sexual Activity  . Alcohol use: Yes    Comment: Daily.  . Drug use: Not on file  . Sexual activity: Not on file  Lifestyle  . Physical activity    Days per week: Not on file    Minutes per session: Not on file  . Stress: Not on file  Relationships  . Social Herbalist on phone: Not on file    Gets together: Not on file    Attends religious service: Not on file    Active member of club or organization: Not on file    Attends meetings of clubs or organizations: Not on file    Relationship status: Not on file  . Intimate partner violence    Fear of current or ex partner: Not on file    Emotionally abused: Not on file    Physically abused: Not on file    Forced sexual activity: Not on file  Other Topics Concern  . Not on file  Social History Narrative  . Not on file    Physical Exam      Future Appointments  Date Time Provider [place]  12/13/2018 11:40 AM Bensimhon, Bevelyn Buckles, MD [facility] None    BP 122/88   Pulse 88   Temp (!) 97.5 F (36.4 C)   Resp 16   SpO2 99%   Weight yesterday-189 Last visit weight-184  Pt returned home from out of town a few days ago. He thinks he picked up a cold. Runny nose, etc. His daughter is sick too with runny nose.  He denies sob, no dizziness, no c/p. No edema noted.  meds verified, he takes the pills from the bottles.  Pt states he is doing well. He is managing his meds. Weight stable, he is gaining a few lbs however he does not do much exercise. 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  He lost the cone assist app that was mailed to him, I got jenna to email me a copy of it and I will print it out and hand deliver and assist. Will see him back in about a month (outside of the cone  assist form).   Kerry Hough, EMT-Paramedic [phone] [facility] Paramedic  11/03/18

## 2018-11-03 NOTE — Telephone Encounter (Signed)
CSW informed by community paramedic that pt has misplaced CAFA and needs another one.  CSW emailed to paramedic who will bring to patient to complete  CSW will continue to follow and assist as needed  Jorge Ny, [facility] Desk#: [phone] Cell#: [phone]

## 2018-11-15 MED FILL — SPIRONOLACTONE 25 MG TABLET: 25 | 30 days supply | Qty: 15 | Fill #2

## 2018-11-16 ENCOUNTER — Other Ambulatory Visit (HOSPITAL_COMMUNITY): Payer: Self-pay

## 2018-11-16 NOTE — Progress Notes (Signed)
I took pt a copy of the cone financial assistance program for him to fill out, he did not answer the phone, I folded it up and placed in his door.  It had no patient information on it.  I texted pt and let him know where its at and once it is filled out I will come get it and turn it in.   Marylouise Stacks, EMT-Paramedic  11/16/18

## 2018-12-06 MED FILL — CARVEDILOL 6.25 MG TABLET: 6.25 | 30 days supply | Qty: 60 | Fill #2

## 2018-12-07 ENCOUNTER — Telehealth (HOSPITAL_COMMUNITY): Payer: Self-pay

## 2018-12-07 NOTE — Telephone Encounter (Signed)
Contacted pt regarding last home visit, pt reports he is doing well. No further home visits necessary. He said he needed help with the entresto number to order refills. I shared contact with pt via text and told him it would be on the bottle itself.  He knows how to order his other meds. He denies needing any further assistance at this time.  Will d/c from paramedicine.   Marylouise Stacks, EMT-Paramedic  12/07/18

## 2018-12-12 NOTE — Progress Notes (Signed)
Advanced HF Clinic Note  Date:  12/13/2018   ID:  Bruce Morse, DOB 1990-06-30, MRN [id number]  Location: Home  Provider location: [place] [facility] Type of Visit: Established patient  PCP:  [name], NP  Cardiologist:  No primary care provider on file. Primary HF: Dr Haroldine Laws   Chief Complaint: Heart Failure   History of Present Illness: Bruce Morse is a 28 y.o. male with a history of chronic systolic CHF, NICM, "congenital heart disease", ETOH abuse, Tobacco abuse, and THC use/abuse. Extensive history at [facility] work up was for the most part negative. He doesn't ever remember getting genetic testing, and thinks that the myocardial biopsy came back with "extra tissue" on the right side of his heart, but isn't sure about any more details  Admitted 3/5 - 3/11/20with abdominal pain and HF.Marland Kitchen EKG with tachycardia and LVH. Echo showed new diagnosis of CHF with EF 20-25%. Taken for cath as below which showed EF 15% with normal coroanies and CI = 2.0 (MV sat 70%). HF meds adjusted as tolerated. He was not discharged on bb.  Echo 10/20/18 35-40% RV normal  He is here for routine f/u. Feeling good. Says he is now working for [employer] and stays active. Denies SOB, orthopnea, edema or PND. Can go up steps without problem.Compliant with meds. No dizziness.   Cath 05/31/18 Ao = 89/68 (76)  LV =  90/9 RA = 2 RV = 22/1 PA = 17/9 (14) PCW = 7 Fick cardiac output/index = 3.8/2.0 PVR = 1.9 WU SVR = 1549 Ao sat = 99% PA sat = 69%, 72%  Assessment: 1. Normal coronary arteries 2. Severe NICM EF 15% 3. Well compensated hemodynamics with moderately to severely reduced CO   Past Medical History:  Diagnosis Date  . Alcohol abuse   . Chronic systolic (congestive) heart failure ([place])   . Congenital heart disease   . Marijuana abuse   . Syncope    at age 35  . Tobacco abuse    Past Surgical History:  Procedure Laterality Date  .  RIGHT/LEFT HEART CATH AND CORONARY ANGIOGRAPHY N/A 05/31/2018   Procedure: RIGHT/LEFT HEART CATH AND CORONARY ANGIOGRAPHY;  Surgeon: Jolaine Artist, MD;  Location: [facility];  Service: Cardiovascular;  Laterality: N/A;     Current Outpatient Medications  Medication Sig Dispense Refill  . carvedilol (COREG) 6.25 MG tablet Take 1 tablet (6.25 mg total) by mouth 2 (two) times daily. 60 tablet 5  . sacubitril-valsartan (ENTRESTO) 49-51 MG Take 1 tablet by mouth 2 (two) times daily. 60 tablet 6  . spironolactone (ALDACTONE) 25 MG tablet Take 12.5 mg by mouth daily.      No current facility-administered medications for this encounter.     Allergies:   Patient has no known allergies.   Social History:  The patient  reports that he has been smoking. He has never used smokeless tobacco. He reports current alcohol use.   Family History:  The patient's family history is not on file.   ROS:  Please see the history of present illness.   All other systems are personally reviewed and negative.  Vitals:   12/13/18 1158  BP: 114/78  Pulse: 94  SpO2: 98%  Weight: 86 kg (189 lb 9.6 oz)    General:  Well appearing. No resp difficulty HEENT: normal Neck: supple. no JVD. Carotids 2+ bilat; no bruits. No lymphadenopathy or thryomegaly appreciated. Cor: PMI nondisplaced. Regular rate & rhythm.  No rubs, gallops or murmurs. Lungs: clear Abdomen: soft, nontender, nondistended. No hepatosplenomegaly. No bruits or masses. Good bowel sounds. Extremities: no cyanosis, clubbing, rash, edema Neuro: alert & orientedx3, cranial nerves grossly intact. moves all 4 extremities w/o difficulty. Affect pleasant  Recent Labs: 05/28/2018: TSH 3.393 05/30/2018: ALT 45 06/01/2018: Platelets 274 06/02/2018: Hemoglobin 16.0 10/11/2018: B Natriuretic Peptide 198.8; BUN 6; Creatinine, Ser 1.07; Magnesium 2.2; Potassium 4.5; Sodium 137  Personally reviewed   Wt Readings from Last 3 Encounters:  12/13/18 86 kg (189  lb 9.6 oz)  10/18/18 83.5 kg (184 lb)  10/11/18 86.4 kg (190 lb 6.4 oz)      ASSESSMENT AND PLAN:  1. Chronic systolic heart failure, NICM - Normal coronaries by cath 3/9/2020on cath EF 15%. -Echo 05/28/2018 LVEF 20-25%, Mild LAE, Mod MR, Mod TR, Mild MR.  - Echo 10/11/18 EF 35-40% RV normal  - NYHA I. Volume status looks good. Off lasix.  - Continue carvedilol 6.25 mg twice a day.   -Increase Entresto to 97/103  mg BID. - Continue spironolactone 12.5 mg daily. - Needs cMRI once he has insurance +/- genetic counseling  - BMET & BNP  2. Congenital heart disease - Extensive work up at [facility] as above. No clear etiology - Will plan cMRI +/- genetic screeningwhen he has insurance  3. H/o NSVT - EF > 35% LifeVest removed in 7/20 - Keep K > 4.0 Mg > 2.0  4. ETOH abuse -Congratulated on abstinence.   5. Tobacco abuse - Smoking 1/2 ppd. Discussed smoking cessation.   Signed, Arvilla Meres, MD  12/13/2018 12:38 PM  [facility] [address] [facility] [place] [address] [phone] (office) [phone] (fax)

## 2018-12-13 ENCOUNTER — Other Ambulatory Visit: Payer: Self-pay

## 2018-12-13 ENCOUNTER — Encounter (HOSPITAL_COMMUNITY): Payer: Self-pay | Admitting: Internal Medicine

## 2018-12-13 ENCOUNTER — Ambulatory Visit (HOSPITAL_COMMUNITY)
Admission: RE | Admit: 2018-12-13 | Discharge: 2018-12-13 | Disposition: A | Discharge status: Home / Self Care | Payer: Self-pay | Source: Ambulatory Visit | Attending: Internal Medicine | Admitting: Internal Medicine

## 2018-12-13 VITALS — BP 114/78 | HR 94 | Wt 189.6 lb

## 2018-12-13 DIAGNOSIS — Z720 Tobacco use: Secondary | ICD-10-CM

## 2018-12-13 DIAGNOSIS — I5022 Chronic systolic (congestive) heart failure: Secondary | ICD-10-CM | POA: Insufficient documentation

## 2018-12-13 DIAGNOSIS — Z7901 Long term (current) use of anticoagulants: Secondary | ICD-10-CM | POA: Insufficient documentation

## 2018-12-13 DIAGNOSIS — F17210 Nicotine dependence, cigarettes, uncomplicated: Secondary | ICD-10-CM | POA: Insufficient documentation

## 2018-12-13 DIAGNOSIS — I428 Other cardiomyopathies: Secondary | ICD-10-CM | POA: Insufficient documentation

## 2018-12-13 DIAGNOSIS — F1010 Alcohol abuse, uncomplicated: Secondary | ICD-10-CM | POA: Insufficient documentation

## 2018-12-13 DIAGNOSIS — Z79899 Other long term (current) drug therapy: Secondary | ICD-10-CM | POA: Insufficient documentation

## 2018-12-13 LAB — BASIC METABOLIC PANEL
Anion gap: 6 (ref 5–15)
BUN: 10 mg/dL (ref 6–20)
CO2: 25 mmol/L (ref 22–32)
Calcium: 9.2 mg/dL (ref 8.9–10.3)
Chloride: 106 mmol/L (ref 98–111)
Creatinine, Ser: 1.06 mg/dL (ref 0.61–1.24)
GFR calc Af Amer: >60 mL/min (ref >60)
GFR calc non Af Amer: >60 mL/min (ref >60)
Glucose, Bld: 120 mg/dL — ABNORMAL HIGH (ref 70–99)
Potassium: 4.3 mmol/L (ref 3.5–5.1)
Sodium: 137 mmol/L (ref 135–145)

## 2018-12-13 LAB — BRAIN NATRIURETIC PEPTIDE: B Natriuretic Peptide: 75.2 pg/mL (ref 0.0–100.0)

## 2018-12-13 MED ORDER — SACUBITRIL-VALSARTAN 97-103 MG PO TABS: 1.0000 | ORAL_TABLET | Freq: Two times a day (BID) | ORAL | 3 refills | Status: DC

## 2018-12-13 NOTE — Progress Notes (Signed)
Medication Samples have been provided to the patient.  Drug name: Delene Loll       Strength: 49/51mg         Qty: 28  LOT: [id number]  Exp.Date: 7/22  Dosing instructions: 1 tab twice a day  The patient has been instructed regarding the correct time, dose, and frequency of taking this medication, including desired effects and most common side effects.   Valeda Malm 2:13 PM 12/13/2018

## 2018-12-13 NOTE — Addendum Note (Signed)
Encounter addended by: Valeda Malm, RN on: 12/13/2018 12:49 PM  Actions taken: Pharmacy for encounter modified, Charge Capture section accepted, Order list changed, Diagnosis association updated, Clinical Note Signed

## 2018-12-13 NOTE — Addendum Note (Signed)
Encounter addended by: Valeda Malm, RN on: 12/13/2018 2:16 PM  Actions taken: Clinical Note Signed

## 2018-12-13 NOTE — Patient Instructions (Signed)
INCREASE Entresto to 97/103mg  (1 tab) twice a day  Labs today We will only contact you if something comes back abnormal or we need to make some changes. Otherwise no news is good news!   Your physician has requested that you have an echocardiogram. Echocardiography is a painless test that uses sound waves to create images of your heart. It provides your doctor with information about the size and shape of your heart and how well your heart's chambers and valves are working. This procedure takes approximately one hour. There are no restrictions for this procedure.  Your physician recommends that you schedule a follow-up appointment in: 2 months with Dr Bruce Morse and an ECHO  At the [facility], you and your health needs are our priority. As part of our continuing mission to provide you with exceptional heart care, we have created designated Provider Care Teams. These Care Teams include your primary Cardiologist (physician) and Advanced Practice Providers (APPs- Physician Assistants and Nurse Practitioners) who all work together to provide you with the care you need, when you need it.   You may see any of the following providers on your designated Care Team at your next follow up: Marland Kitchen Dr Glori Bickers . Dr Loralie Champagne . Darrick Grinder, NP   Please be sure to bring in all your medications bottles to every appointment.

## 2018-12-16 MED FILL — SPIRONOLACTONE 25 MG TABLET: 25 | 30 days supply | Qty: 15 | Fill #3

## 2019-01-14 MED FILL — SPIRONOLACTONE 25 MG TABS: 25 | 30 days supply | Qty: 15 | Fill #4

## 2019-01-14 MED FILL — CARVEDILOL 6.25 MG TABLET: 6.25 | 30 days supply | Qty: 60 | Fill #3

## 2019-02-03 ENCOUNTER — Telehealth (HOSPITAL_COMMUNITY): Payer: Self-pay | Admitting: Licensed Clinical Social Worker

## 2019-02-03 NOTE — Telephone Encounter (Signed)
CSW reaching out to patient because they are currently receiving medications through [facility].  CSW called to discuss if pt is eligible for any alternative coverage options.  CSW confirmed with pt that he had recently been denied for Medicaid/disability so this would not be an option at this time.  CSW has sent pt CAFA 2 times but states he has misplaced- CSW mailed out another one.  Pt currently working parttime but is not eligible for benefits- CSW discussed applying for insurance through [facility] as it is currently open enrollment.  CSW also mailed patient information on how to apply for [facility] insurance.  No further concerns at this time- CSW will continue to follow to assist as needed.  Jorge Ny, LCSW Clinical Social Worker Advanced Heart Failure Clinic Desk#: [phone] Cell#: [phone]

## 2019-02-15 ENCOUNTER — Telehealth (HOSPITAL_COMMUNITY): Payer: Self-pay

## 2019-02-15 NOTE — Telephone Encounter (Signed)
Tried calling patient to go over covid pre screen questions for tomorrow since he has an appointment, no answer,left message to return call  

## 2019-02-16 ENCOUNTER — Other Ambulatory Visit: Payer: Self-pay

## 2019-02-16 ENCOUNTER — Encounter (HOSPITAL_COMMUNITY): Payer: Self-pay | Admitting: Internal Medicine

## 2019-02-16 ENCOUNTER — Ambulatory Visit (HOSPITAL_BASED_OUTPATIENT_CLINIC_OR_DEPARTMENT_OTHER)
Admission: RE | Admit: 2019-02-16 | Discharge: 2019-02-16 | Disposition: A | Discharge status: Home / Self Care | Payer: Self-pay | Source: Ambulatory Visit | Attending: Internal Medicine | Admitting: Internal Medicine

## 2019-02-16 ENCOUNTER — Ambulatory Visit (HOSPITAL_COMMUNITY)
Admission: RE | Admit: 2019-02-16 | Discharge: 2019-02-16 | Disposition: A | Discharge status: Home / Self Care | Payer: Self-pay | Source: Ambulatory Visit | Attending: Internal Medicine | Admitting: Internal Medicine

## 2019-02-16 VITALS — BP 116/74 | HR 82 | Wt 188.0 lb

## 2019-02-16 DIAGNOSIS — R000 Tachycardia, unspecified: Secondary | ICD-10-CM | POA: Insufficient documentation

## 2019-02-16 DIAGNOSIS — F1021 Alcohol dependence, in remission: Secondary | ICD-10-CM | POA: Insufficient documentation

## 2019-02-16 DIAGNOSIS — Z79899 Other long term (current) drug therapy: Secondary | ICD-10-CM | POA: Insufficient documentation

## 2019-02-16 DIAGNOSIS — F1211 Cannabis abuse, in remission: Secondary | ICD-10-CM | POA: Insufficient documentation

## 2019-02-16 DIAGNOSIS — I428 Other cardiomyopathies: Secondary | ICD-10-CM | POA: Insufficient documentation

## 2019-02-16 DIAGNOSIS — Z720 Tobacco use: Secondary | ICD-10-CM

## 2019-02-16 DIAGNOSIS — Q249 Congenital malformation of heart, unspecified: Secondary | ICD-10-CM | POA: Insufficient documentation

## 2019-02-16 DIAGNOSIS — I5022 Chronic systolic (congestive) heart failure: Secondary | ICD-10-CM

## 2019-02-16 DIAGNOSIS — F17210 Nicotine dependence, cigarettes, uncomplicated: Secondary | ICD-10-CM | POA: Insufficient documentation

## 2019-02-16 DIAGNOSIS — I340 Nonrheumatic mitral (valve) insufficiency: Secondary | ICD-10-CM | POA: Insufficient documentation

## 2019-02-16 LAB — BASIC METABOLIC PANEL
Anion gap: 10 (ref 5–15)
BUN: 10 mg/dL (ref 6–20)
CO2: 24 mmol/L (ref 22–32)
Calcium: 9.6 mg/dL (ref 8.9–10.3)
Chloride: 103 mmol/L (ref 98–111)
Creatinine, Ser: 1.07 mg/dL (ref 0.61–1.24)
GFR calc Af Amer: >60 mL/min (ref >60)
GFR calc non Af Amer: >60 mL/min (ref >60)
Glucose, Bld: 95 mg/dL (ref 70–99)
Potassium: 4.6 mmol/L (ref 3.5–5.1)
Sodium: 137 mmol/L (ref 135–145)

## 2019-02-16 MED ORDER — CARVEDILOL 6.25 MG PO TABS: 9.3750 mg | ORAL_TABLET | Freq: Two times a day (BID) | ORAL | 3 refills | Status: DC

## 2019-02-16 MED ORDER — SPIRONOLACTONE 25 MG PO TABS: 12.5000 mg | ORAL_TABLET | Freq: Every day | ORAL | 3 refills | Status: DC

## 2019-02-16 NOTE — Addendum Note (Signed)
Encounter addended by: Shonna Chock, CMA on: 15/86/8257 11:33 AM  Actions taken: Pharmacy for encounter modified, Visit diagnoses modified, Order list changed, Diagnosis association updated, Charge Capture section accepted, Clinical Note Signed

## 2019-02-16 NOTE — Progress Notes (Signed)
  Echocardiogram 2D Echocardiogram has been performed.  Christeen Lai A Tyri Elmore 02/16/2019, 10:35 AM

## 2019-02-16 NOTE — Progress Notes (Signed)
Advanced HF Clinic Note  Date:  02/16/2019   ID:  Bruce Morse, DOB 03-04-1991, MRN [id number]  Location: Home  Provider location: [place] Advanced Heart Failure Type of Visit: Established patient  PCP:  Grayce Sessions, NP  Cardiologist:  No primary care provider on file. Primary HF: Dr Gala Romney   Chief Complaint: Heart Failure   History of Present Illness: Bruce Morse is a 28 y.o. male with a history of chronic systolic CHF, NICM, "congenital heart disease", ETOH abuse, Tobacco abuse, and THC use/abuse. Extensive history at [facility] work up was for the most part negative. He doesn't ever remember getting genetic testing, and thinks that the myocardial biopsy came back with "extra tissue" on the right side of his heart, but isn't sure about any more details  Admitted 3/5 - 3/11/20with abdominal pain and HF.Marland Kitchen EKG with tachycardia and LVH. Echo showed new diagnosis of CHF with EF 20-25%. Taken for cath as below which showed EF 15% with normal coroanies and CI = 2.0 (MV sat 70%). HF meds adjusted as tolerated. He was not discharged on bb.  Echo 10/20/18 35-40% RV normal  He is here for routine f/u. Feeling good. Still working at [employer]. Denies SOB or CP. No edema. Taking all medications as prescribed.   Echo today 35-40% Personally reviewed   Cath 05/31/18 Ao = 89/68 (76)  LV =  90/9 RA = 2 RV = 22/1 PA = 17/9 (14) PCW = 7 Fick cardiac output/index = 3.8/2.0 PVR = 1.9 WU SVR = 1549 Ao sat = 99% PA sat = 69%, 72%  Assessment: 1. Normal coronary arteries 2. Severe NICM EF 15% 3. Well compensated hemodynamics with moderately to severely reduced CO   Past Medical History:  Diagnosis Date  . Alcohol abuse   . Chronic systolic (congestive) heart failure ([facility])   . Congenital heart disease   . Marijuana abuse   . Syncope    at age 72  . Tobacco abuse    Past Surgical History:  Procedure Laterality Date  . RIGHT/LEFT HEART CATH AND  CORONARY ANGIOGRAPHY N/A 05/31/2018   Procedure: RIGHT/LEFT HEART CATH AND CORONARY ANGIOGRAPHY;  Surgeon: Dolores Patty, MD;  [facility];  Service: Cardiovascular;  Laterality: N/A;     Current Outpatient Medications  Medication Sig Dispense Refill  . carvedilol (COREG) 6.25 MG tablet Take 1 tablet (6.25 mg total) by mouth 2 (two) times daily. 60 tablet 5  . sacubitril-valsartan (ENTRESTO) 97-103 MG Take 1 tablet by mouth 2 (two) times daily. 180 tablet 3  . spironolactone (ALDACTONE) 25 MG tablet Take 12.5 mg by mouth daily.      No current facility-administered medications for this encounter.     Allergies:   Patient has no known allergies.   Social History:  The patient  reports that he has been smoking. He has never used smokeless tobacco. He reports current alcohol use.   Family History:  The patient's family history is not on file.   ROS:  Please see the history of present illness.   All other systems are personally reviewed and negative.  Vitals:   02/16/19 1041  BP: 116/74  Pulse: 82  SpO2: 98%  Weight: 85.3 kg (188 lb)    General:  Well appearing. No resp difficulty HEENT: normal Neck: supple. no JVD. Carotids 2+ bilat; no bruits. No lymphadenopathy or thryomegaly appreciated. Cor: PMI nondisplaced. Regular rate & rhythm. No rubs, gallops or murmurs. Lungs:  clear Abdomen: soft, nontender, nondistended. No hepatosplenomegaly. No bruits or masses. Good bowel sounds. Extremities: no cyanosis, clubbing, rash, edema Neuro: alert & orientedx3, cranial nerves grossly intact. moves all 4 extremities w/o difficulty. Affect pleasant   Recent Labs: 05/28/2018: TSH 3.393 05/30/2018: ALT 45 06/01/2018: Platelets 274 06/02/2018: Hemoglobin 16.0 10/11/2018: Magnesium 2.2 12/13/2018: B Natriuretic Peptide 75.2; BUN 10; Creatinine, Ser 1.06; Potassium 4.3; Sodium 137  Personally reviewed   Wt Readings from Last 3 Encounters:  02/16/19 85.3 kg (188 lb)   12/13/18 86 kg (189 lb 9.6 oz)  10/18/18 83.5 kg (184 lb)      ASSESSMENT AND PLAN:  1. Chronic systolic heart failure, NICM - Normal coronaries by cath 3/9/2020on cath EF 15%. -Echo 05/28/2018 LVEF 20-25%, Mild LAE, Mod MR, Mod TR, Mild MR.  - Echo 10/11/18 EF 35-40% RV normal  - Echo today 02/16/19 EF 35-40%  - Stable NYHA I. Volume status looks good. Off lasix.  - Increase carvedilol to 9.375 mg twice a day.   -Continue Entresto to 97/103  mg BID. - Continue spironolactone 12.5 mg daily. - Needs cMRI once he has insurance +/- genetic counseling  (he is filling out application for insurance now) - BMET  2. Congenital heart disease - Extensive work up at [facility] as above. No clear etiology - Will plan cMRI +/- genetic screeningwhen he has insurance  3. H/o NSVT - EF > 35% LifeVest removed in 7/20 - Keep K > 4.0 Mg > 2.0  4. ETOH abuse -Congratulated on abstinence.   5. Tobacco abuse - Smoking  < 1/2 ppd. Discussed smoking cessation.   Signed, Glori Bickers, MD  02/16/2019 11:20 AM  [facility] [address] Heart and [place] [address] [phone] (office) [phone] (fax)

## 2019-02-16 NOTE — Patient Instructions (Signed)
Labs done today. We will contact you only if your labs are abnormal.  INCREASE Carvedilol 9.375mg (1 and 1/2 tab) by mouth twice daily  Please continue all other medications as prescribed. (meds Refilled)  Your physician recommends that you schedule a follow-up appointment in: 4 months  At the [facility], you and your health needs are our priority. As part of our continuing mission to provide you with exceptional heart care, we have created designated Provider Care Teams. These Care Teams include your primary Cardiologist (physician) and Advanced Practice Providers (APPs- Physician Assistants and Nurse Practitioners) who all work together to provide you with the care you need, when you need it.   You may see any of the following providers on your designated Care Team at your next follow up: Marland Kitchen Dr Glori Bickers . Dr Loralie Champagne . Darrick Grinder, NP . Lyda Jester, PA   Please be sure to bring in all your medications bottles to every appointment.

## 2019-03-14 MED FILL — SPIRONOLACTONE 25 MG TABS: 25 | 30 days supply | Qty: 15 | Fill #1

## 2019-03-14 MED FILL — CARVEDILOL 6.25 MG TABLET: 6.25 | 20 days supply | Qty: 60 | Fill #1

## 2019-04-04 MED FILL — CARVEDILOL 6.25 MG TABLET: 6.25 | 20 days supply | Qty: 60 | Fill #2

## 2019-04-04 MED FILL — spIRONOLACTONE 25 MG TABS: 25 | 30 days supply | Qty: 15 | Fill #2

## 2019-04-28 MED FILL — CARVEDILOL 6.25 MG TABLET: 6.25 | 20 days supply | Qty: 60 | Fill #3

## 2019-05-13 ENCOUNTER — Telehealth (HOSPITAL_COMMUNITY): Payer: Self-pay | Admitting: Pharmacist

## 2019-05-13 NOTE — Telephone Encounter (Signed)
Received Notice from [employer] that patient is due to [facility] re-enrollment in the next few months. Completed application and left patient portion at fonrt desk to sign. Patient confirmed he will come sign application as well as provide annual household income. Will fax in application once completed.   Karle Plumber, PharmD, BCPS, BCCP, CPP Heart Failure Clinic Pharmacist [phone]

## 2019-05-20 MED FILL — CARVEDILOL 6.25 MG TABLET: 6.25 | 30 days supply | Qty: 60 | Fill #4

## 2019-05-20 MED FILL — SPIRONOLACTONE 25 MG TABS: 25 | 30 days supply | Qty: 15 | Fill #5

## 2019-06-15 MED FILL — CARVEDILOL 6.25 MG TABLET: 6.25 | 30 days supply | Qty: 60 | Fill #5

## 2019-06-15 MED FILL — SPIRONOLACTONE 25 MG TABS: 25 | 30 days supply | Qty: 15 | Fill #3

## 2019-06-17 ENCOUNTER — Ambulatory Visit (HOSPITAL_COMMUNITY)
Admission: RE | Admit: 2019-06-17 | Discharge: 2019-06-17 | Disposition: A | Discharge status: Home / Self Care | Payer: Self-pay | Source: Ambulatory Visit | Attending: Internal Medicine | Admitting: Internal Medicine

## 2019-06-17 ENCOUNTER — Other Ambulatory Visit: Payer: Self-pay

## 2019-06-17 ENCOUNTER — Encounter (HOSPITAL_COMMUNITY): Payer: Self-pay | Admitting: Internal Medicine

## 2019-06-17 ENCOUNTER — Ambulatory Visit: Payer: Self-pay | Attending: Internal Medicine

## 2019-06-17 ENCOUNTER — Other Ambulatory Visit (HOSPITAL_COMMUNITY): Payer: Self-pay | Admitting: Internal Medicine

## 2019-06-17 VITALS — BP 118/74 | HR 86 | Ht 68.0 in | Wt 183.2 lb

## 2019-06-17 DIAGNOSIS — F17210 Nicotine dependence, cigarettes, uncomplicated: Secondary | ICD-10-CM | POA: Insufficient documentation

## 2019-06-17 DIAGNOSIS — I428 Other cardiomyopathies: Secondary | ICD-10-CM | POA: Insufficient documentation

## 2019-06-17 DIAGNOSIS — I5022 Chronic systolic (congestive) heart failure: Secondary | ICD-10-CM | POA: Insufficient documentation

## 2019-06-17 DIAGNOSIS — Q249 Congenital malformation of heart, unspecified: Secondary | ICD-10-CM | POA: Insufficient documentation

## 2019-06-17 DIAGNOSIS — Z79899 Other long term (current) drug therapy: Secondary | ICD-10-CM | POA: Insufficient documentation

## 2019-06-17 DIAGNOSIS — Z23 Encounter for immunization: Secondary | ICD-10-CM | POA: Insufficient documentation

## 2019-06-17 DIAGNOSIS — Z720 Tobacco use: Secondary | ICD-10-CM

## 2019-06-17 DIAGNOSIS — Z7901 Long term (current) use of anticoagulants: Secondary | ICD-10-CM | POA: Insufficient documentation

## 2019-06-17 DIAGNOSIS — F1010 Alcohol abuse, uncomplicated: Secondary | ICD-10-CM | POA: Insufficient documentation

## 2019-06-17 DIAGNOSIS — F17200 Nicotine dependence, unspecified, uncomplicated: Secondary | ICD-10-CM | POA: Insufficient documentation

## 2019-06-17 LAB — BASIC METABOLIC PANEL
Anion gap: 9 (ref 5–15)
BUN: 14 mg/dL (ref 6–20)
CO2: 21 mmol/L — ABNORMAL LOW (ref 22–32)
Calcium: 9.1 mg/dL (ref 8.9–10.3)
Chloride: 105 mmol/L (ref 98–111)
Creatinine, Ser: 0.97 mg/dL (ref 0.61–1.24)
GFR calc Af Amer: >60 mL/min (ref >60)
GFR calc non Af Amer: >60 mL/min (ref >60)
Glucose, Bld: 98 mg/dL (ref 70–99)
Potassium: 4.1 mmol/L (ref 3.5–5.1)
Sodium: 135 mmol/L (ref 135–145)

## 2019-06-17 LAB — BRAIN NATRIURETIC PEPTIDE: B Natriuretic Peptide: 27.9 pg/mL (ref 0.0–100.0)

## 2019-06-17 MED ORDER — CARVEDILOL 12.5 MG PO TABS: 12.5000 mg | ORAL_TABLET | Freq: Two times a day (BID) | ORAL | 8 refills | Status: DC

## 2019-06-17 MED FILL — CARVEDILOL 12.5 MG TABLET: 12.5 | 30 days supply | Qty: 60 | Fill #0

## 2019-06-17 NOTE — Patient Instructions (Addendum)
INCREASE Carvedilol 12.5mg  (1 tab) twice a day   Labs today We will only contact you if something comes back abnormal or we need to make some changes. Otherwise no news is good news!   Your physician has requested that you have an echocardiogram. Echocardiography is a painless test that uses sound waves to create images of your heart. It provides your doctor with information about the size and shape of your heart and how well your heart's chambers and valves are working. This procedure takes approximately one hour. There are no restrictions for this procedure.   Your physician recommends that you schedule a follow-up appointment in: 6 months for an ECHO and an appointment with Dr Gala Romney.  We will call you to schedule this appointment.   Please call office at [phone] option 2 if you have any questions or concerns.    At the [facility] Clinic, you and your health needs are our priority. As part of our continuing mission to provide you with exceptional heart care, we have created designated Provider Care Teams. These Care Teams include your primary Cardiologist (physician) and Advanced Practice Providers (APPs- Physician Assistants and Nurse Practitioners) who all work together to provide you with the care you need, when you need it.   You may see any of the following providers on your designated Care Team at your next follow up: Marland Kitchen Dr Arvilla Meres . Dr Marca Ancona . Tonye Becket, NP . Robbie Lis, PA . Karle Plumber, PharmD   Please be sure to bring in all your medications bottles to every appointment.

## 2019-06-17 NOTE — Progress Notes (Signed)
Advanced HF Clinic Note  Date:  06/17/2019   ID:  Bruce Morse, DOB November 08, 1990, MRN [id number]  Location: Home  Provider location: [place] [facility] Type of Visit: Established patient  PCP:  [name], NP  Cardiologist:  No primary care provider on file. Primary HF: Dr Haroldine Laws   Chief Complaint: Heart Failure   History of Present Illness: Bruce Morse is a 29 y.o. male with a history of chronic systolic CHF, NICM, "congenital heart disease", ETOH abuse, Tobacco abuse, and THC use/abuse. Extensive history at [facility] work up was for the most part negative. He doesn't ever remember getting genetic testing, and thinks that the myocardial biopsy came back with "extra tissue" on the right side of his heart, but isn't sure about any more details  Admitted 3/5 - 3/11/20with abdominal pain and HF.Marland Kitchen EKG with tachycardia and LVH. Echo showed new diagnosis of CHF with EF 20-25%. Taken for cath as below which showed EF 15% with normal coroanies and CI = 2.0 (MV sat 70%). HF meds adjusted as tolerated. He was not discharged on bb.  Echo 10/20/18 35-40% RV normal  He is here for routine f/u. Feeling good. Now working at [employer]. Active no SOB, CP or edema. Taking all meds. No dizziness or low BP.   Echo 11/20 35-40% Personally reviewed   Cath 05/31/18 Ao = 89/68 (76)  LV =  90/9 RA = 2 RV = 22/1 PA = 17/9 (14) PCW = 7 Fick cardiac output/index = 3.8/2.0 PVR = 1.9 WU SVR = 1549 Ao sat = 99% PA sat = 69%, 72%  Assessment: 1. Normal coronary arteries 2. Severe NICM EF 15% 3. Well compensated hemodynamics with moderately to severely reduced CO   Past Medical History:  Diagnosis Date  . Alcohol abuse   . Chronic systolic (congestive) heart failure ([place])   . Congenital heart disease   . Marijuana abuse   . Syncope    at age 71  . Tobacco abuse    Past Surgical History:  Procedure Laterality Date  . RIGHT/LEFT HEART CATH AND  CORONARY ANGIOGRAPHY N/A 05/31/2018   Procedure: RIGHT/LEFT HEART CATH AND CORONARY ANGIOGRAPHY;  Surgeon: Jolaine Artist, MD;  Location: [place] [facility];  Service: Cardiovascular;  Laterality: N/A;     Current Outpatient Medications  Medication Sig Dispense Refill  . carvedilol (COREG) 6.25 MG tablet Take 1.5 tablets (9.375 mg total) by mouth 2 (two) times daily. 60 tablet 3  . sacubitril-valsartan (ENTRESTO) 97-103 MG Take 1 tablet by mouth 2 (two) times daily. 180 tablet 3  . spironolactone (ALDACTONE) 25 MG tablet Take 0.5 tablets (12.5 mg total) by mouth daily. 15 tablet 3   No current facility-administered medications for this encounter.    Allergies:   Patient has no known allergies.   Social History:  The patient  reports that he has been smoking cigarettes. He has never used smokeless tobacco. He reports previous alcohol use. He reports that he does not use drugs.   Family History:  The patient's family history is not on file.   ROS:  Please see the history of present illness.   All other systems are personally reviewed and negative.  Vitals:   06/17/19 1039  BP: 118/74  Pulse: 86  Weight: 83.1 kg (183 lb 3.2 oz)  Height: 5\' 8"  (1.727 m)    General:  Well appearing. No resp difficulty HEENT: normal Neck: supple. no JVD. Carotids 2+ bilat; no bruits.  No lymphadenopathy or thryomegaly appreciated. Cor: PMI nondisplaced. Regular rate & rhythm. No rubs, gallops or murmurs. Lungs: clear Abdomen: soft, nontender, nondistended. No hepatosplenomegaly. No bruits or masses. Good bowel sounds. Extremities: no cyanosis, clubbing, rash, edema Neuro: alert & orientedx3, cranial nerves grossly intact. moves all 4 extremities w/o difficulty. Affect pleasant   Recent Labs: 10/11/2018: Magnesium 2.2 12/13/2018: B Natriuretic Peptide 75.2 02/16/2019: BUN 10; Creatinine, Ser 1.07; Potassium 4.6; Sodium 137  Personally reviewed   Wt Readings from Last 3 Encounters:  06/17/19  83.1 kg (183 lb 3.2 oz)  02/16/19 85.3 kg (188 lb)  12/13/18 86 kg (189 lb 9.6 oz)      ASSESSMENT AND PLAN:  1. Chronic systolic heart failure, NICM - Normal coronaries by cath 3/9/2020on cath EF 15%. -Echo 05/28/2018 LVEF 20-25%, Mild LAE, Mod MR, Mod TR, Mild MR.  - Echo 10/11/18 EF 35-40% RV normal  - Echo  02/16/19 EF 35-40%  - Doing well. Remains NYHA I.  - Volume status looks good. Off lasix.  - Increase carvedilol to 12.5 mg twice a day.   -Continue Entresto to 97/103  mg BID. - Continue spironolactone 12.5 mg daily. - Needs cMRI once he has insurance +/- genetic counseling  (he is filling out application for insurance now). Will refer to SW - Can consider SGLT2i soon.   2. Congenital heart disease - Extensive work up at [facility] as above. No clear etiology - Will plan cMRI +/- genetic screeningwhen he has insurance - We discussed this again today.   3. H/o NSVT - EF > 35% LifeVest removed in 7/20 - Keep K > 4.0 Mg > 2.0  4. ETOH abuse -Congratulated on abstinence.   5. Tobacco abuse - Smoking  < 1/2 ppd. Discussed smoking cessation   Signed, Arvilla Meres, MD  06/17/2019 10:47 AM  Advanced Heart Clinic [facility] [address] [facility] [place] [address] [phone] (office) [phone] (fax)

## 2019-06-17 NOTE — Progress Notes (Signed)
CSW consulted to speak with pt regarding insurance concerns.  CSW had spoken with pt in the past regarding several options but non of which have worked out at this time.  Pt is still only working part time so is not eligible for insurance through is job.  Pt had applied previously for Medicaid and disability but was denied last year.  Pt has since had a baby and the baby lives with him 100% of the time and he is considered a legal guardian.  Pt believes the baby has medicaid.  CSW encouraged pt to apply for Medicaid again under Family and Childrens Medicaid.  CSW emailed pt Medicaid application as well as link to apply online.    CSW had sent patient several CAFA applications over the past several months and he did submit the application in January of this year but did not submit supporting documents- because of this he was denied and can't reapply for 6 months.  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  Provided application to pt to review and turn in when completed.  No further needs at this time CSW will continue to follow and assist as needed  Burna Sis, LCSW Clinical Social Worker Advanced Heart Failure Clinic Desk#: [phone] Cell#: [phone]

## 2019-06-17 NOTE — Progress Notes (Signed)
   Covid-19 Vaccination Clinic  Name:  Joseff Luckman    MRN: [id number] DOB: 09-Jan-1991  06/17/2019  Mr. Westcott was observed post Covid-19 immunization for 15 minutes without incident. He was provided with Vaccine Information Sheet and instruction to access the V-Safe system.   Mr. Caughlin was instructed to call 911 with any severe reactions post vaccine: Marland Kitchen Difficulty breathing  . Swelling of face and throat  . A fast heartbeat  . A bad rash all over body  . Dizziness and weakness   Immunizations Administered    Name Date Dose VIS Date Route   Pfizer COVID-19 Vaccine 06/17/2019  8:38 AM 0.3 mL 03/04/2019 Intramuscular   Manufacturer: [employer], [employer]   Lot: [id number]   NDC: [address]-3

## 2019-06-17 NOTE — Addendum Note (Signed)
Encounter addended by: Burna Sis, LCSW on: 06/17/2019 1:31 PM  Actions taken: Clinical Note Signed

## 2019-06-17 NOTE — Addendum Note (Signed)
Encounter addended by: Marisa Hua, RN on: 06/17/2019 10:59 AM  Actions taken: Visit diagnoses modified, Pharmacy for encounter modified, Charge Capture section accepted, Order list changed, Diagnosis association updated, Clinical Note Signed

## 2019-07-01 ENCOUNTER — Telehealth (HOSPITAL_COMMUNITY): Payer: Self-pay | Admitting: Licensed Clinical Social Worker

## 2019-07-01 NOTE — Telephone Encounter (Signed)
CSW called pt to check in regarding progress with getting insurance coverage.  Pt reports he has completed the [employer] application but hasn't applied for Family and Childrens Medicaid at this time.  CSW will continue to follow and assist as needed  Burna Sis, LCSW Clinical Social Worker Advanced Heart Failure Clinic Desk#: [phone] Cell#: [phone]

## 2019-07-13 ENCOUNTER — Ambulatory Visit: Payer: Self-pay | Attending: Internal Medicine

## 2019-07-13 DIAGNOSIS — Z23 Encounter for immunization: Secondary | ICD-10-CM

## 2019-07-13 NOTE — Progress Notes (Signed)
   Covid-19 Vaccination Clinic  Name:  Bruce Morse    MRN: [id number] DOB: 07/02/1990  07/13/2019  [name] was observed post Covid-19 immunization for 15 minutes without incident. He was provided with Vaccine Information Sheet and instruction to access the V-Safe system.   [name] was instructed to call 911 with any severe reactions post vaccine: Marland Kitchen Difficulty breathing  . Swelling of face and throat  . A fast heartbeat  . A bad rash all over body  . Dizziness and weakness   Immunizations Administered    Name Date Dose VIS Date Route   Pfizer COVID-19 Vaccine 07/13/2019  2:19 PM 0.3 mL 05/18/2018 Intramuscular   Manufacturer: [employer], [employer]   Lot: [id number]   NDC: [address]-0

## 2019-07-14 ENCOUNTER — Other Ambulatory Visit (HOSPITAL_COMMUNITY): Payer: Self-pay | Admitting: Internal Medicine

## 2019-07-14 ENCOUNTER — Telehealth (HOSPITAL_COMMUNITY): Payer: Self-pay | Admitting: Pharmacist

## 2019-07-14 MED FILL — SPIRONOLACTONE 25 MG TABS: 25 | 30 days supply | Qty: 15 | Fill #0

## 2019-07-14 NOTE — Telephone Encounter (Signed)
Sent in Manufacturer's Assistance application to [employer] for Entresto re-enrollment.    Application pending, will continue to follow.   Nahsir Venezia, PharmD, BCPS, BCCP, CPP Heart Failure Clinic Pharmacist [phone]  

## 2019-07-15 NOTE — Telephone Encounter (Signed)
Advanced Heart Failure Patient Advocate Encounter   Patient was approved to receive Entresto from [employer].  Patient ID: [id number] Effective dates: 07/15/19 through 07/14/20  Karle Plumber, PharmD, BCPS, BCCP, CPP Heart Failure Clinic Pharmacist [phone]

## 2019-07-18 MED FILL — CARVEDILOL 12.5 MG TABLET: 12.5 | 30 days supply | Qty: 60 | Fill #0

## 2019-08-30 ENCOUNTER — Other Ambulatory Visit (HOSPITAL_COMMUNITY): Payer: Self-pay | Admitting: Adult Health

## 2019-10-04 MED FILL — SPIRONOLACTONE 25 MG TABS: 25 | 30 days supply | Qty: 15 | Fill #2

## 2019-10-04 MED FILL — CARVEDILOL 12.5 MG TABLET: 12.5 | 30 days supply | Qty: 60 | Fill #0

## 2019-11-21 MED FILL — CARVEDILOL 12.5 MG TABLET: 12.5 | 30 days supply | Qty: 60 | Fill #1

## 2019-11-21 MED FILL — SPIRONOLACTONE 25 MG TABS: 25 | 30 days supply | Qty: 15 | Fill #3

## 2019-12-28 MED FILL — CARVEDILOL 12.5 MG TABLET: 12.5 | 30 days supply | Qty: 60 | Fill #2

## 2020-01-23 NOTE — Progress Notes (Addendum)
Advanced HF Clinic Note  Date:  01/25/2020   ID:  [name], DOB 11-25-1990, MRN [id number]  Location: Home  Provider location: [place] [facility] Failure Type of Visit: Established patient  PCP:  Grayce Sessions, NP  Cardiologist:  No primary care provider on file. Primary HF: Dr Gala Romney   Chief Complaint: Heart Failure   History of Present Illness: Hobson Lax is a 29 y.o. male with a history of chronic systolic CHF, NICM, "congenital heart disease", ETOH abuse, Tobacco abuse, and THC use/abuse. Extensive history at [facility] work up was for the most part negative. He doesn't ever remember getting genetic testing, and thinks that the myocardial biopsy came back with "extra tissue" on the right side of his heart, but isn't sure about any more details  Admitted 3/5 - 3/11/20with abdominal pain and HF.Marland Kitchen EKG with tachycardia and LVH. Echo showed new diagnosis of CHF with EF 20-25%. Taken for cath as below which showed EF 15% with normal coroanies and CI = 2.0 (MV sat 70%). HF meds adjusted as tolerated. He was not discharged on bb.  Echo 10/20/18 35-40% RV normal  Echo 11/20 35-40% Personally reviewed  Here for routine f/u. Working at [employer]. Doing a lot of walking. No CP or SOB. No swelling. No problems with meds. No dizziness. Quit cigarettes  Echo today 01/25/20 EF 40-45%  Cath 05/31/18 Ao = 89/68 (76)  LV =  90/9 RA = 2 RV = 22/1 PA = 17/9 (14) PCW = 7 Fick cardiac output/index = 3.8/2.0 PVR = 1.9 WU SVR = 1549 Ao sat = 99% PA sat = 69%, 72%  Assessment: 1. Normal coronary arteries 2. Severe NICM EF 15% 3. Well compensated hemodynamics with moderately to severely reduced CO   Past Medical History:  Diagnosis Date  . Alcohol abuse   . Chronic systolic (congestive) heart failure ([facility])   . Congenital heart disease   . Marijuana abuse   . Syncope    at age 28  . Tobacco abuse    Past Surgical History:  Procedure Laterality  Date  . RIGHT/LEFT HEART CATH AND CORONARY ANGIOGRAPHY N/A 05/31/2018   Procedure: RIGHT/LEFT HEART CATH AND CORONARY ANGIOGRAPHY;  Surgeon: Dolores Patty, MD;  Location: [facility];  Service: Cardiovascular;  Laterality: N/A;     Current Outpatient Medications  Medication Sig Dispense Refill  . carvedilol (COREG) 12.5 MG tablet Take 1 tablet (12.5 mg total) by mouth 2 (two) times daily. 60 tablet 8  . sacubitril-valsartan (ENTRESTO) 97-103 MG Take 1 tablet by mouth 2 (two) times daily. 180 tablet 3   No current facility-administered medications for this encounter.    Allergies:   Patient has no known allergies.   Social History:  The patient  reports that he quit smoking about 4 months ago. His smoking use included cigarettes. He has never used smokeless tobacco. He reports previous alcohol use. He reports that he does not use drugs.   Family History:  The patient's family history is not on file.   ROS:  Please see the history of present illness.   All other systems are personally reviewed and negative.  Vitals:   01/25/20 1425  BP: 108/76  Pulse: 85  SpO2: 98%  Weight: 80.9 kg (178 lb 6.4 oz)  Height: 5\' 9"  (1.753 m)    General:  Well appearing. No resp difficulty HEENT: normal Neck: supple. no JVD. Carotids 2+ bilat; no bruits. No lymphadenopathy or thryomegaly appreciated.  Cor: PMI nondisplaced. Regular rate & rhythm. No rubs, gallops or murmurs. Lungs: clear Abdomen: soft, nontender, nondistended. No hepatosplenomegaly. No bruits or masses. Good bowel sounds. Extremities: no cyanosis, clubbing, rash, edema Neuro: alert & orientedx3, cranial nerves grossly intact. moves all 4 extremities w/o difficulty. Affect pleasant   Recent Labs: 06/17/2019: B Natriuretic Peptide 27.9; BUN 14; Creatinine, Ser 0.97; Potassium 4.1; Sodium 135  Personally reviewed   Wt Readings from Last 3 Encounters:  01/25/20 80.9 kg (178 lb 6.4 oz)  06/17/19 83.1 kg (183 lb 3.2 oz)    02/16/19 85.3 kg (188 lb)    ECG: NSR 85 No ST-T wave abnormalities. Personally reviewed  ASSESSMENT AND PLAN:  1. Chronic systolic heart failure, NICM - Normal coronaries by cath 3/9/2020on cath EF 15%. -Echo 05/28/2018 LVEF 20-25%, Mild LAE, Mod MR, Mod TR, Mild MR.  - Echo 10/11/18 EF 35-40% RV normal  - Echo  02/16/19 EF 35-40%  - Echo today 01/25/20 EF 40-45% - Doing well. NYHA I - Volume status ok - Continue carvedilol12.5 mg twice a day.   -Continue Entresto to 97/103  mg BID. - Increase spiro to 25  - Needs cMRI once he has insurance +/- genetic counseling  (he is filling out application for insurance now). Will refer to SW - Can consider SGLT2i soon.  - ICD interrogated in clinic personally. No VT/AF. Volume Ok. Activity 4hr/day  2. Congenital heart disease - Extensive work up at [facility] as above. No clear etiology - Will plan cMRI +/- genetic screeningwhen he has insurance - No change  3. H/o NSVT - EF > 35% LifeVest removed in 7/20 - Keep K > 4.0 Mg > 2.0  4. ETOH abuse -Congratulated on abstinence.   5. Tobacco abuse - Congratualted him on quitting  Signed, Arvilla Meres, MD  01/25/2020 3:03 PM  Advanced Heart Clinic [facility] [address] Heart and Vascular Center [place] [address] [phone] (office) [phone] (fax)

## 2020-01-25 ENCOUNTER — Ambulatory Visit (HOSPITAL_BASED_OUTPATIENT_CLINIC_OR_DEPARTMENT_OTHER)
Admission: RE | Admit: 2020-01-25 | Discharge: 2020-01-25 | Disposition: A | Discharge status: Home / Self Care | Payer: Self-pay | Source: Ambulatory Visit

## 2020-01-25 ENCOUNTER — Other Ambulatory Visit: Payer: Self-pay

## 2020-01-25 ENCOUNTER — Encounter (HOSPITAL_COMMUNITY): Payer: Self-pay | Admitting: Internal Medicine

## 2020-01-25 ENCOUNTER — Ambulatory Visit (HOSPITAL_COMMUNITY)
Admission: RE | Admit: 2020-01-25 | Discharge: 2020-01-25 | Disposition: A | Discharge status: Home / Self Care | Payer: Self-pay | Source: Ambulatory Visit | Attending: Internal Medicine | Admitting: Internal Medicine

## 2020-01-25 VITALS — BP 108/76 | HR 85 | Ht 69.0 in | Wt 178.4 lb

## 2020-01-25 DIAGNOSIS — F1010 Alcohol abuse, uncomplicated: Secondary | ICD-10-CM

## 2020-01-25 DIAGNOSIS — Z87891 Personal history of nicotine dependence: Secondary | ICD-10-CM | POA: Insufficient documentation

## 2020-01-25 DIAGNOSIS — I5022 Chronic systolic (congestive) heart failure: Secondary | ICD-10-CM

## 2020-01-25 DIAGNOSIS — F1011 Alcohol abuse, in remission: Secondary | ICD-10-CM | POA: Insufficient documentation

## 2020-01-25 DIAGNOSIS — Z79899 Other long term (current) drug therapy: Secondary | ICD-10-CM | POA: Insufficient documentation

## 2020-01-25 DIAGNOSIS — I472 Ventricular tachycardia: Secondary | ICD-10-CM | POA: Insufficient documentation

## 2020-01-25 DIAGNOSIS — Z720 Tobacco use: Secondary | ICD-10-CM

## 2020-01-25 DIAGNOSIS — Q249 Congenital malformation of heart, unspecified: Secondary | ICD-10-CM | POA: Insufficient documentation

## 2020-01-25 DIAGNOSIS — I428 Other cardiomyopathies: Secondary | ICD-10-CM | POA: Insufficient documentation

## 2020-01-25 LAB — BASIC METABOLIC PANEL
Anion gap: 10 (ref 5–15)
BUN: 12 mg/dL (ref 6–20)
CO2: 22 mmol/L (ref 22–32)
Calcium: 9.4 mg/dL (ref 8.9–10.3)
Chloride: 104 mmol/L (ref 98–111)
Creatinine, Ser: 1.09 mg/dL (ref 0.61–1.24)
GFR, Estimated: >60 mL/min (ref >60)
Glucose, Bld: 116 mg/dL — ABNORMAL HIGH (ref 70–99)
Potassium: 4.2 mmol/L (ref 3.5–5.1)
Sodium: 136 mmol/L (ref 135–145)

## 2020-01-25 LAB — ECHOCARDIOGRAM COMPLETE
Area-P 1/2: 5.66 cm2
S' Lateral: 3.8 cm

## 2020-01-25 MED ORDER — SPIRONOLACTONE 25 MG PO TABS: 25.0000 mg | ORAL_TABLET | Freq: Every day | ORAL | 3 refills | Status: DC

## 2020-01-25 NOTE — Progress Notes (Signed)
Echocardiogram 2D Echocardiogram has been performed.  Pieter Partridge 01/25/2020, 1:57 PM

## 2020-01-25 NOTE — Patient Instructions (Signed)
START Spironolactone 25 mg, one tab daily  Labs today We will only contact you if something comes back abnormal or we need to make some changes. Otherwise no news is good news!  Your physician recommends that you schedule a follow-up appointment in: 6 months with Dr Gala Romney -please give our office a call in April to schedule a May 2022 appointment   If you have any questions or concerns before your next appointment please send [place] a message through [place] or call our office at [phone].    TO LEAVE A MESSAGE FOR THE NURSE SELECT OPTION 2, PLEASE LEAVE A MESSAGE INCLUDING: . YOUR NAME . DATE OF BIRTH . CALL BACK NUMBER . REASON FOR CALL**this is important as we prioritize the call backs  YOU WILL RECEIVE A CALL BACK THE SAME DAY AS LONG AS YOU CALL BEFORE 4:00 PM

## 2020-01-26 ENCOUNTER — Other Ambulatory Visit (HOSPITAL_COMMUNITY): Payer: Self-pay | Admitting: Internal Medicine

## 2020-01-26 ENCOUNTER — Other Ambulatory Visit (HOSPITAL_COMMUNITY): Payer: Self-pay | Admitting: Cardiology

## 2020-01-26 MED ORDER — SPIRONOLACTONE 25 MG PO TABS: 25.0000 mg | ORAL_TABLET | Freq: Every day | ORAL | 3 refills | Status: DC

## 2020-01-26 MED FILL — SPIRONOLACTONE 25 MG TABS: 25 | 30 days supply | Qty: 30 | Fill #0

## 2020-01-26 NOTE — Addendum Note (Signed)
Encounter addended by: Burna Sis, LCSW on: 01/26/2020 9:04 AM  Actions taken: Clinical Note Signed

## 2020-01-26 NOTE — Progress Notes (Signed)
CSW consulted to meet with pt regarding current lack of insurance.  CSW met with pt to discuss- pt reports he is currently working and has insurance through his job but that it doesn't cover anything.  Pt showed CSW his insurance card and appears to be catastrophic insurance in case of a hospital admission- pt states it doesn't work to cover anything else like outpatient visits or medications.  CSW provided pt with [facility] Financial Assistance application to help with [facility] bills- pt had turned in before without supporting documents so was denied so CSW stressed the need to turn in all supporting documents at same time as application.  CSW also discussed pt looking into [facility] insurance.  Provided pt with packet explaining how to apply for [facility] insurance and find out how much he would have to pay.  Encouraged pt to reach out if he has any questions  Jorge Ny, [facility] Desk#: [phone] Cell#: [phone]

## 2020-02-08 MED FILL — CARVEDILOL 12.5 MG TABLET: 12.5 | 30 days supply | Qty: 60 | Fill #3

## 2020-02-08 MED FILL — SPIRONOLACTONE 25 MG TABS: 25 | 30 days supply | Qty: 30 | Fill #0

## 2020-03-26 MED FILL — CARVEDILOL 12.5 MG TABLET: 12.5 | 30 days supply | Qty: 60 | Fill #4

## 2020-03-26 MED FILL — SPIRONOLACTONE 25 MG TABS: 25 | 30 days supply | Qty: 30 | Fill #1

## 2020-04-30 MED FILL — SPIRONOLACTONE 25 MG TABS: 25 | 30 days supply | Qty: 30 | Fill #2

## 2020-05-16 MED FILL — CARVEDILOL 12.5 MG TABLET: 12.5 | 30 days supply | Qty: 60 | Fill #5

## 2020-05-29 ENCOUNTER — Telehealth (HOSPITAL_COMMUNITY): Payer: Self-pay | Admitting: Pharmacist

## 2020-05-29 MED ORDER — SACUBITRIL-VALSARTAN 97-103 MG PO TABS: 1.0000 | ORAL_TABLET | Freq: Two times a day (BID) | ORAL | 3 refills | Status: DC

## 2020-05-29 NOTE — Telephone Encounter (Signed)
Sent refill for Entresto to [employer] [facility] ([facility] [employer]) per patient request. Instructed patient that he would need to call the pharmacy to get the medication shipped to him.   Additionally, his enrollment in the [employer] PAP for Sherryll Burger will expire at the end of April. Left message for patient to call me back so we can re-apply for patient assistance.    Karle Plumber, PharmD, BCPS, BCCP, CPP Heart Failure Clinic Pharmacist [phone]

## 2020-06-06 ENCOUNTER — Telehealth (HOSPITAL_COMMUNITY): Payer: Self-pay | Admitting: Internal Medicine

## 2020-06-06 MED ORDER — SACUBITRIL-VALSARTAN 97-103 MG PO TABS: 1.0000 | ORAL_TABLET | Freq: Two times a day (BID) | ORAL | 3 refills | Status: DC

## 2020-06-06 NOTE — Telephone Encounter (Signed)
Pt request Entresto 97-103 MG refill, pt can be reached @[phone] - . Thanks

## 2020-06-06 NOTE — Telephone Encounter (Signed)
Pt called to request refills of entresto Refills were returned to [facility] pharmacy (3/8)-mail order Unclear what is needed at this time, will continue to reach out to patient?

## 2020-06-06 NOTE — Telephone Encounter (Signed)
Refills sent again to pharmacy [facility]

## 2020-06-20 ENCOUNTER — Telehealth (HOSPITAL_COMMUNITY): Payer: Self-pay | Admitting: Pharmacy Technician

## 2020-06-20 MED FILL — SPIRONOLACTONE 25 MG TABLET: 25 | 30 days supply | Qty: 30 | Fill #3

## 2020-06-20 NOTE — Telephone Encounter (Signed)
It's time to re-enroll patient to receive medication assistance for Entresto from [employer]. I received the patient porion and will fax in once the provider's signature is obtained.

## 2020-06-22 NOTE — Telephone Encounter (Signed)
Sent in application via fax.  Will follow up.  

## 2020-07-02 ENCOUNTER — Telehealth (HOSPITAL_COMMUNITY): Payer: Self-pay | Admitting: Pharmacy Technician

## 2020-07-02 NOTE — Telephone Encounter (Signed)
Advanced Heart Failure Patient Advocate Encounter   Patient was approved to receive Entresto from [employer]  Patient ID: [id number] Effective dates: 06/25/20 through 07/14/21  Called and left the patient message.   Archer Asa, CPhT

## 2020-09-03 ENCOUNTER — Telehealth (HOSPITAL_COMMUNITY): Payer: Self-pay | Admitting: Internal Medicine

## 2020-09-03 ENCOUNTER — Other Ambulatory Visit (HOSPITAL_COMMUNITY): Payer: Self-pay

## 2020-09-03 MED ORDER — SPIRONOLACTONE 25 MG PO TABS
ORAL_TABLET | Freq: Every day | ORAL | 3 refills | Status: DC
  Filled 2020-09-03 – 2020-09-14 (×2): qty 30, 30d supply, fill #0

## 2020-09-03 MED ORDER — CARVEDILOL 12.5 MG PO TABS
ORAL_TABLET | Freq: Two times a day (BID) | ORAL | 3 refills | Status: DC
  Filled 2020-09-03 – 2020-09-14 (×2): qty 60, 30d supply, fill #0

## 2020-09-03 MED ORDER — SACUBITRIL-VALSARTAN 97-103 MG PO TABS
1.0000 | ORAL_TABLET | Freq: Two times a day (BID) | ORAL | 0 refills | Status: DC
  Filled 2020-09-03 – 2020-09-14 (×2): qty 180, 90d supply, fill #0

## 2020-09-03 NOTE — Telephone Encounter (Signed)
Done

## 2020-09-03 NOTE — Telephone Encounter (Signed)
Pt request refills for carvedilol and, spironolactone, please send scripts to [facility], pt can be reached @[phone]. Thanks

## 2020-09-04 ENCOUNTER — Other Ambulatory Visit (HOSPITAL_COMMUNITY): Payer: Self-pay

## 2020-09-06 ENCOUNTER — Other Ambulatory Visit (HOSPITAL_COMMUNITY): Payer: Self-pay

## 2020-09-14 ENCOUNTER — Other Ambulatory Visit (HOSPITAL_COMMUNITY): Payer: Self-pay

## 2020-09-14 ENCOUNTER — Other Ambulatory Visit (HOSPITAL_COMMUNITY): Payer: Self-pay | Admitting: *Deleted

## 2020-09-14 MED ORDER — SPIRONOLACTONE 25 MG PO TABS
25.0000 mg | ORAL_TABLET | Freq: Every day | ORAL | 3 refills | Status: DC
  Filled 2020-09-14: qty 30, 30d supply, fill #0
  Filled 2020-11-14: qty 30, 30d supply, fill #1
  Filled 2021-02-22: qty 30, 30d supply, fill #2
  Filled 2021-05-30: qty 30, 30d supply, fill #3

## 2020-09-14 MED ORDER — CARVEDILOL 12.5 MG PO TABS
12.5000 mg | ORAL_TABLET | Freq: Two times a day (BID) | ORAL | 3 refills | Status: DC
  Filled 2020-09-14: qty 60, 30d supply, fill #0
  Filled 2020-11-14: qty 60, 30d supply, fill #1
  Filled 2021-02-22: qty 60, 30d supply, fill #2
  Filled 2021-08-01: qty 60, 30d supply, fill #3

## 2020-11-14 ENCOUNTER — Other Ambulatory Visit (HOSPITAL_COMMUNITY): Payer: Self-pay

## 2021-02-22 ENCOUNTER — Other Ambulatory Visit (HOSPITAL_COMMUNITY): Payer: Self-pay

## 2021-02-24 IMAGING — CT CT ABD-PELV W/ CM
2 of 4 series · 16 of 46 positions shown, 18 images · IV contrast (omnipaque)
Comparison: None.

CLINICAL DATA: Abdominal pain.

EXAM:
CT ABDOMEN AND PELVIS WITH CONTRAST
TECHNIQUE: Multidetector CT imaging of the abdomen and pelvis was performed
using the standard protocol following bolus administration of
intravenous contrast.
CONTRAST:  100mL OMNIPAQUE IOHEXOL 300 MG/ML  SOLN

[Series 3: abdomen 5.0 · axial · 0.87mm/px · z∈[-571,-181]mm · 13 of 88 slices shown, 15 images]
[im 5/88  soft-tissue]
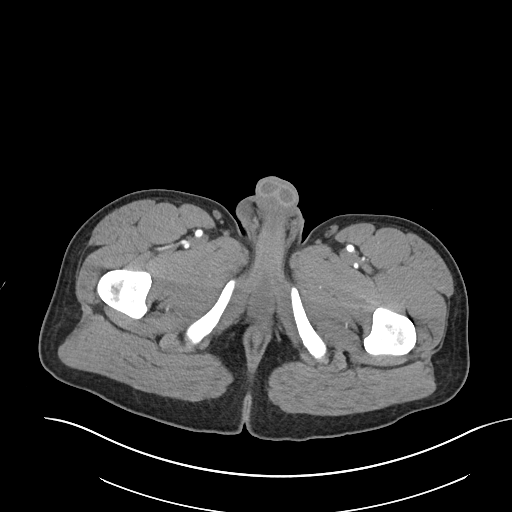
[im 5/88  bone]
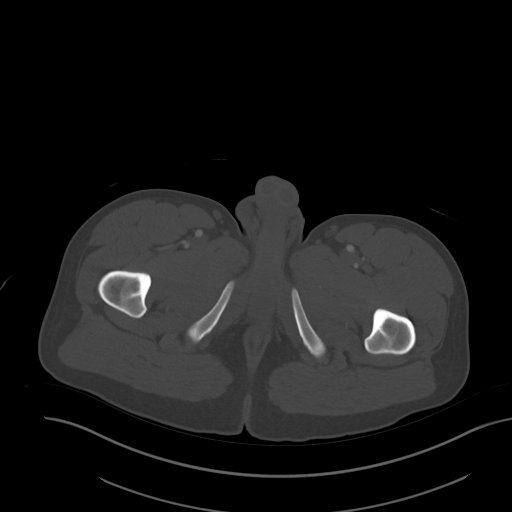
[im 10/88  soft-tissue]
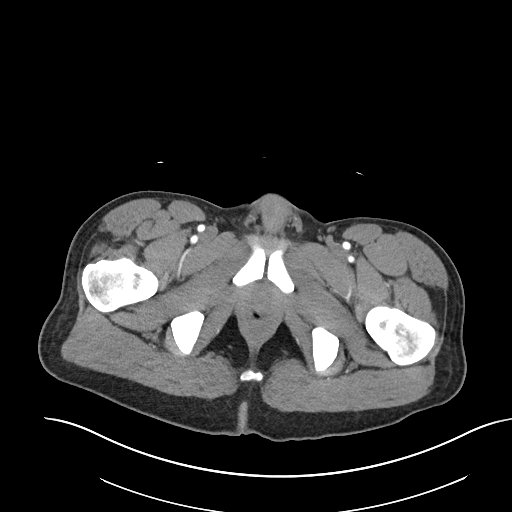
[im 20/88  soft-tissue]
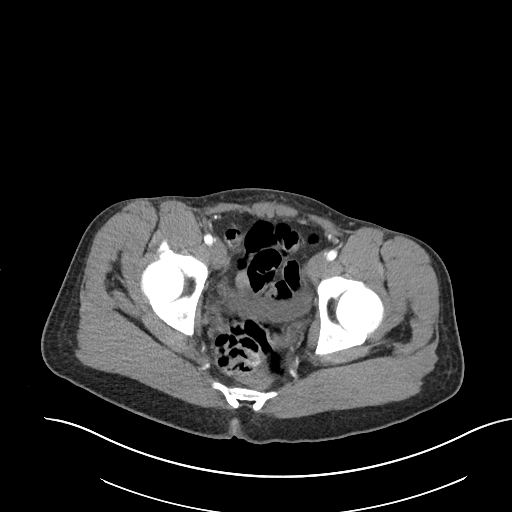
[im 25/88  soft-tissue]
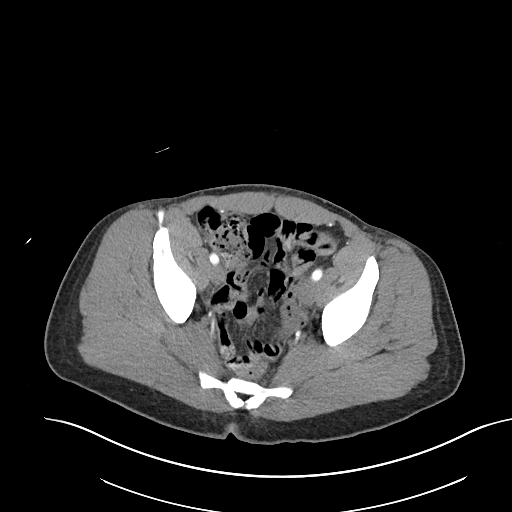
[im 30/88  soft-tissue]
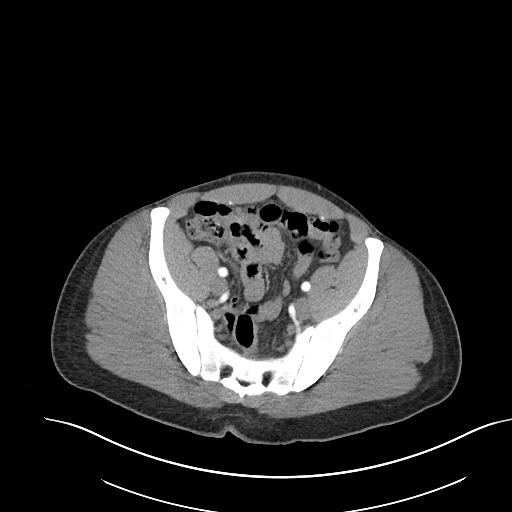
[im 39/88  soft-tissue]
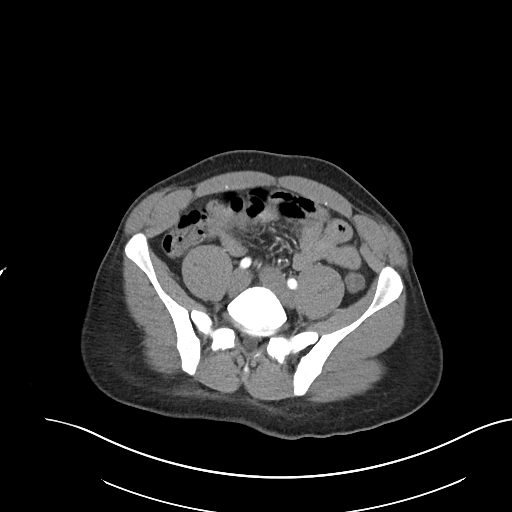
[im 44/88  soft-tissue]
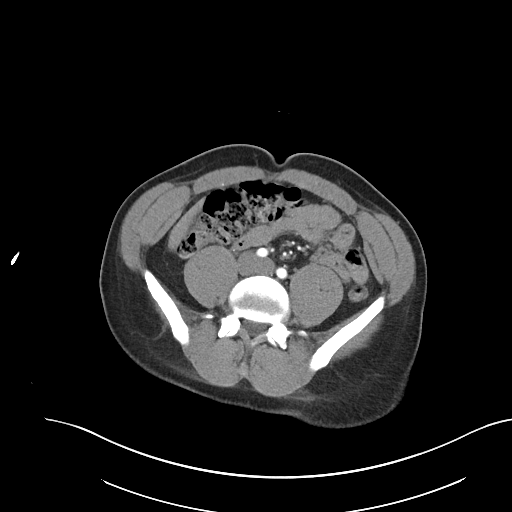
[im 49/88  soft-tissue]
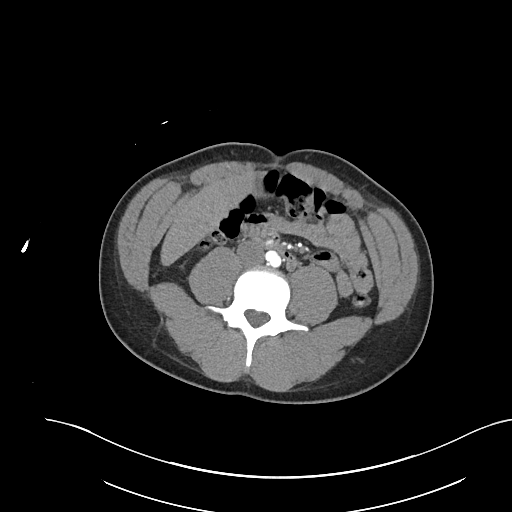
[im 59/88  soft-tissue]
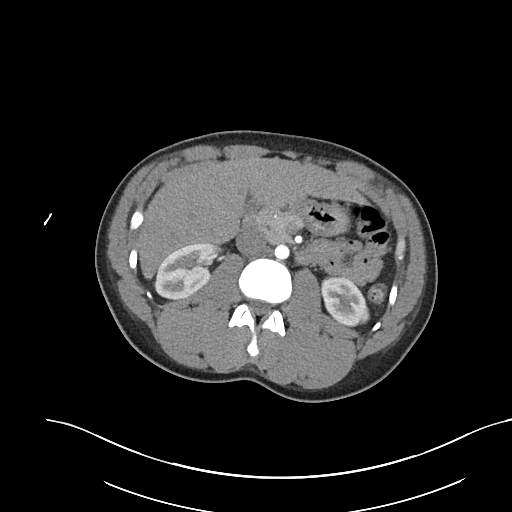
[im 59/88  bone]
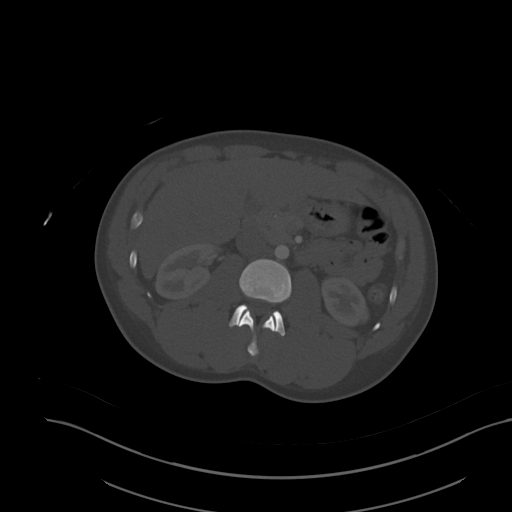
[im 63/88  soft-tissue]
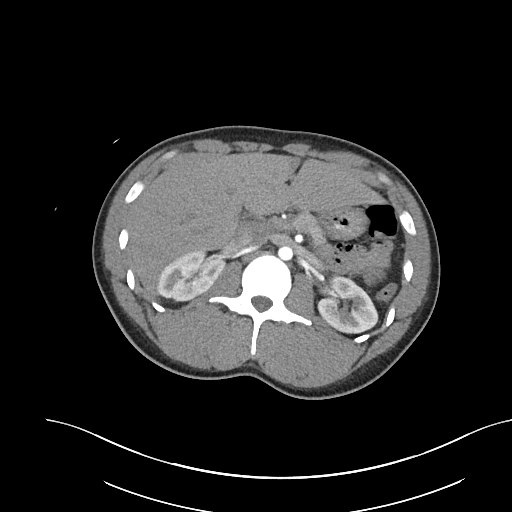
[im 68/88  soft-tissue]
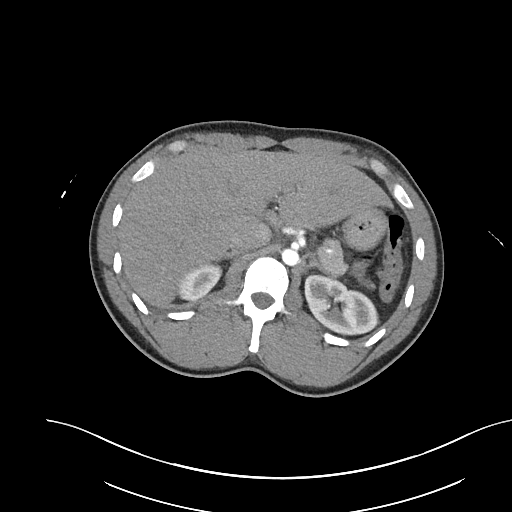
[im 78/88  soft-tissue]
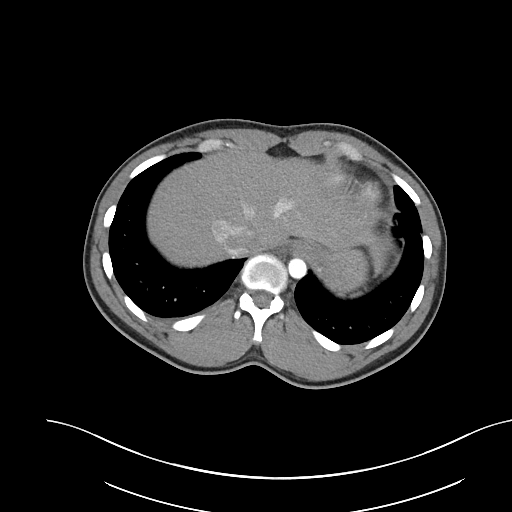
[im 83/88  soft-tissue]
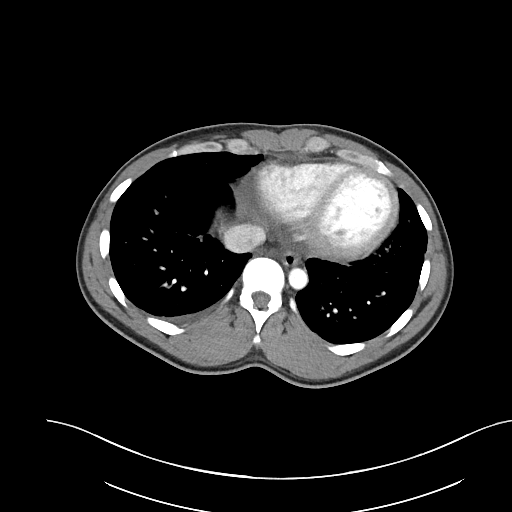

[Series 6: abdomen 3.0 mpr cor · coronal · 0.71mm/px · 3 of 101 slices shown]
[im 34/101  soft-tissue]
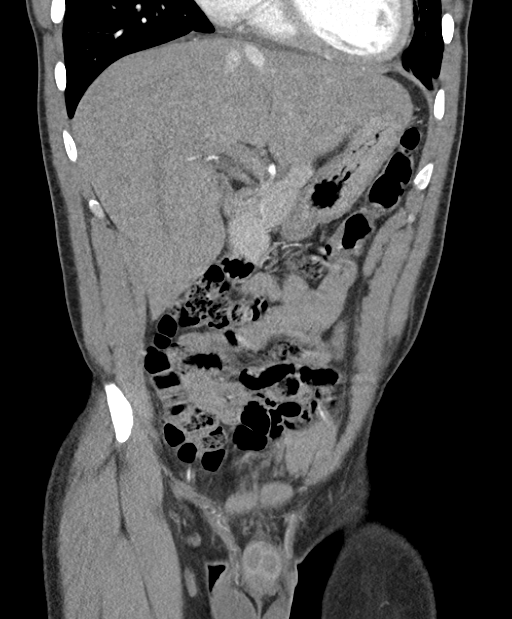
[im 45/101  soft-tissue]
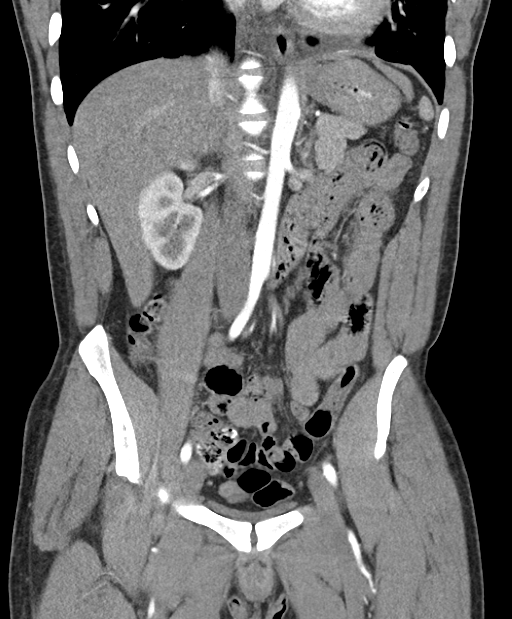
[im 56/101  soft-tissue]
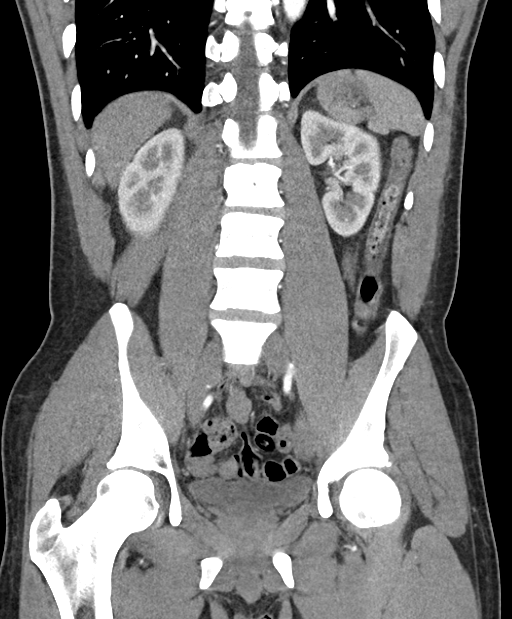

[16 of 46 positions shown; findings below may reference images not displayed]

FINDINGS: Lower chest: Geographic ground-glass opacities in the bases. Mild
smooth septal thickening. Heart is prominent size for age.

Hepatobiliary: No focal hepatic lesion, however examination is
performed on arterial phase imaging which limits detailed
parenchymal assessment. Gallbladder partially distended, no
calcified stone. No biliary dilatation.

Pancreas: No ductal dilatation or inflammation.

Spleen: Normal in size without focal abnormality.

Adrenals/Urinary Tract: Normal adrenal glands. No hydronephrosis or
perinephric edema. Homogeneous renal enhancement. Urinary bladder is
nondistended and well evaluated.

Stomach/Bowel: Bowel evaluation is limited in the absence of enteric
contrast and paucity of intra-abdominal fat. Stomach is
nondistended. Small amount of high-density ingested material in the
stomach and duodenum. No small bowel dilatation, inflammation, or
obstruction. Normal appendix, for example images 58-61 series 3.
Moderate stool in the proximal colon the remainder the colon is
decompressed which limits assessment, no obvious colonic wall
thickening. No pericolonic inflammation. No visualize colonic
diverticula.

Vascular/Lymphatic: Imaging obtained in arterial phase. Abdominal
aorta is normal in caliber. No adenopathy.

Reproductive: Prostate is unremarkable.

Other: No free air or free fluid.

Musculoskeletal: There are no acute or suspicious osseous
abnormalities.
IMPRESSION: 1. No acute abnormality in the abdomen/pelvis.
2. Geographic ground-glass opacities in the lung bases with smooth
septal thickening. Findings suggest pulmonary edema. Recommend
correlation with chest radiograph.

## 2021-02-24 IMAGING — DX DG CHEST 2V
2 series · 2 of 2 positions shown · non-contrast
Comparison: CT 05/27/2018

CLINICAL DATA: Stomach pain

EXAM:
CHEST - 2 VIEW

[chest lat]
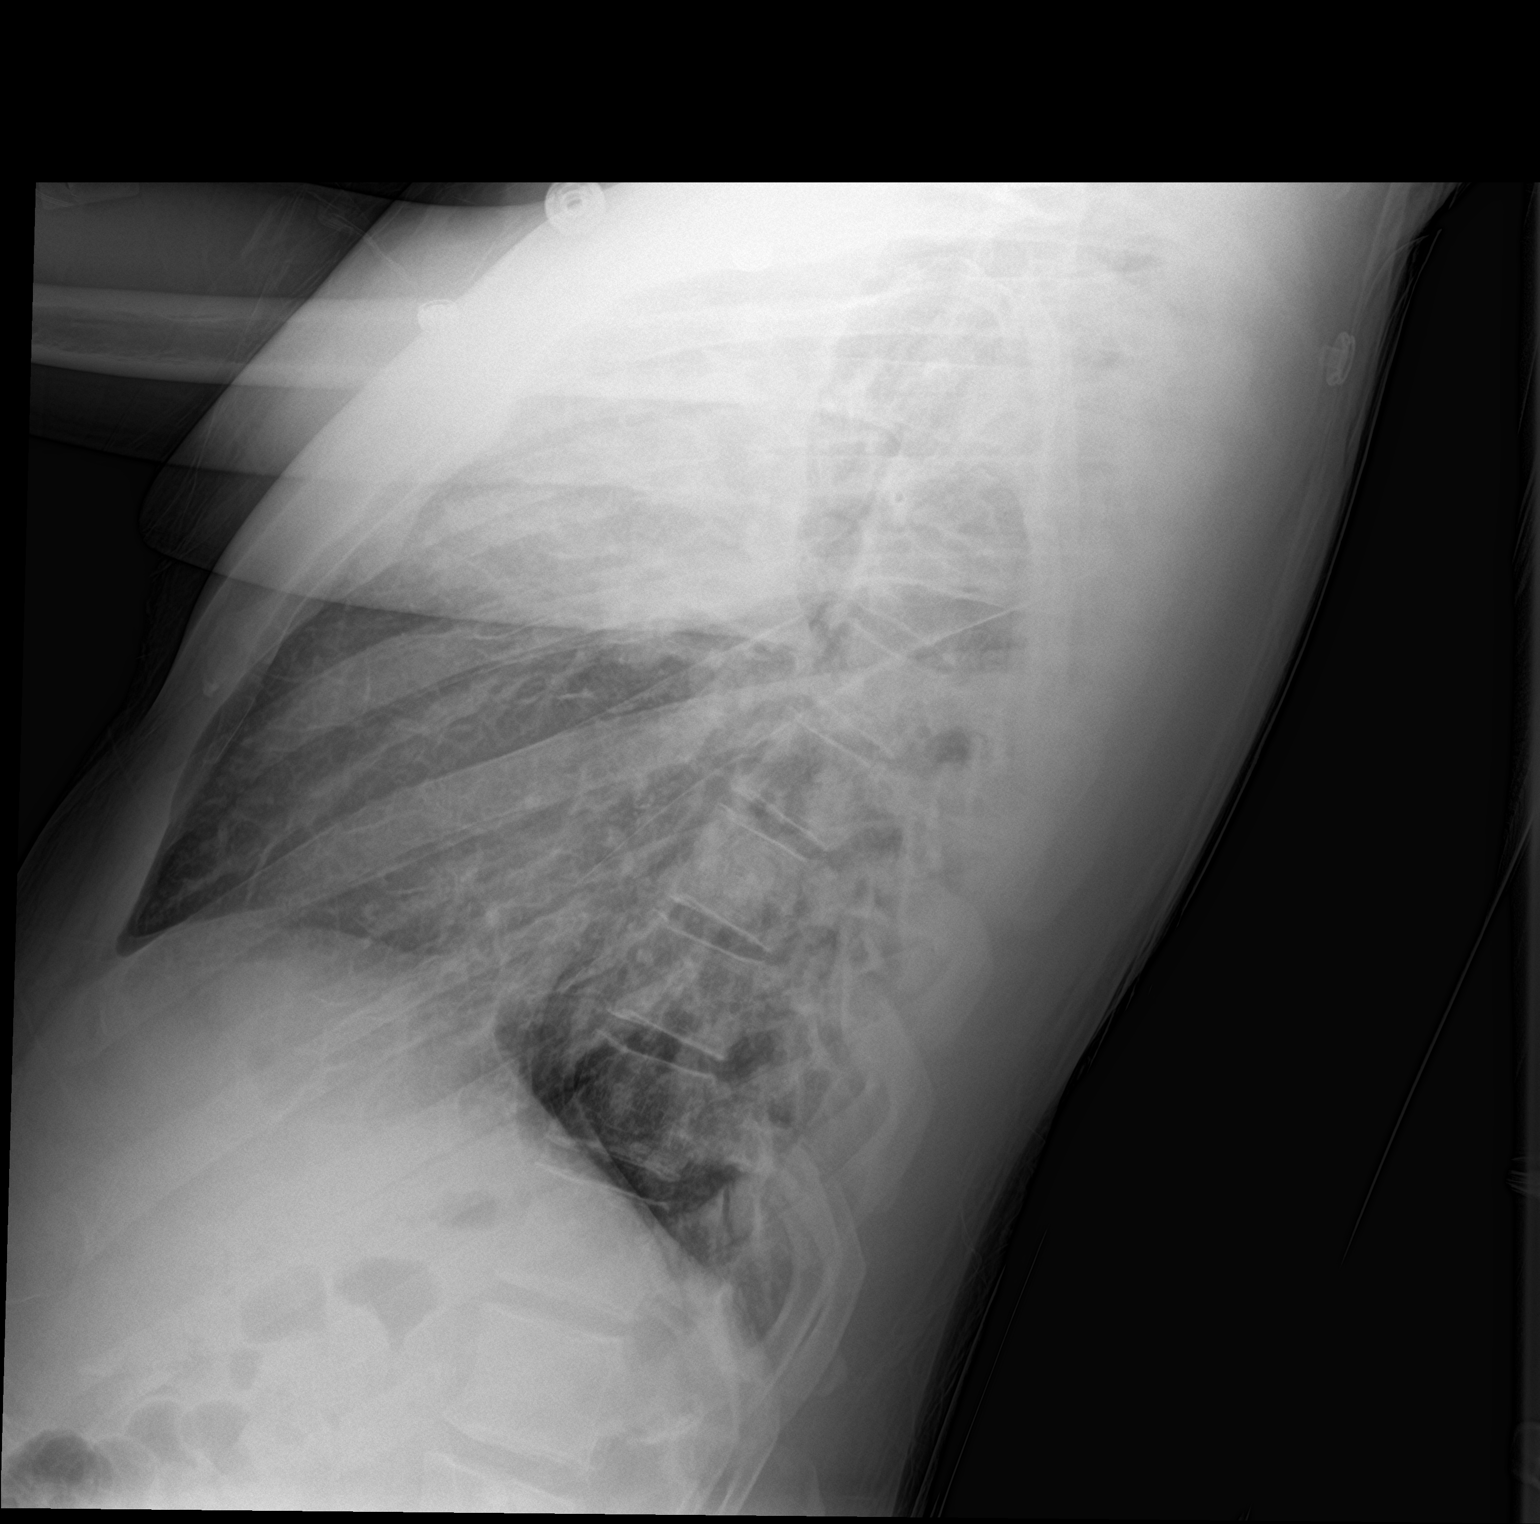

[chest ap]
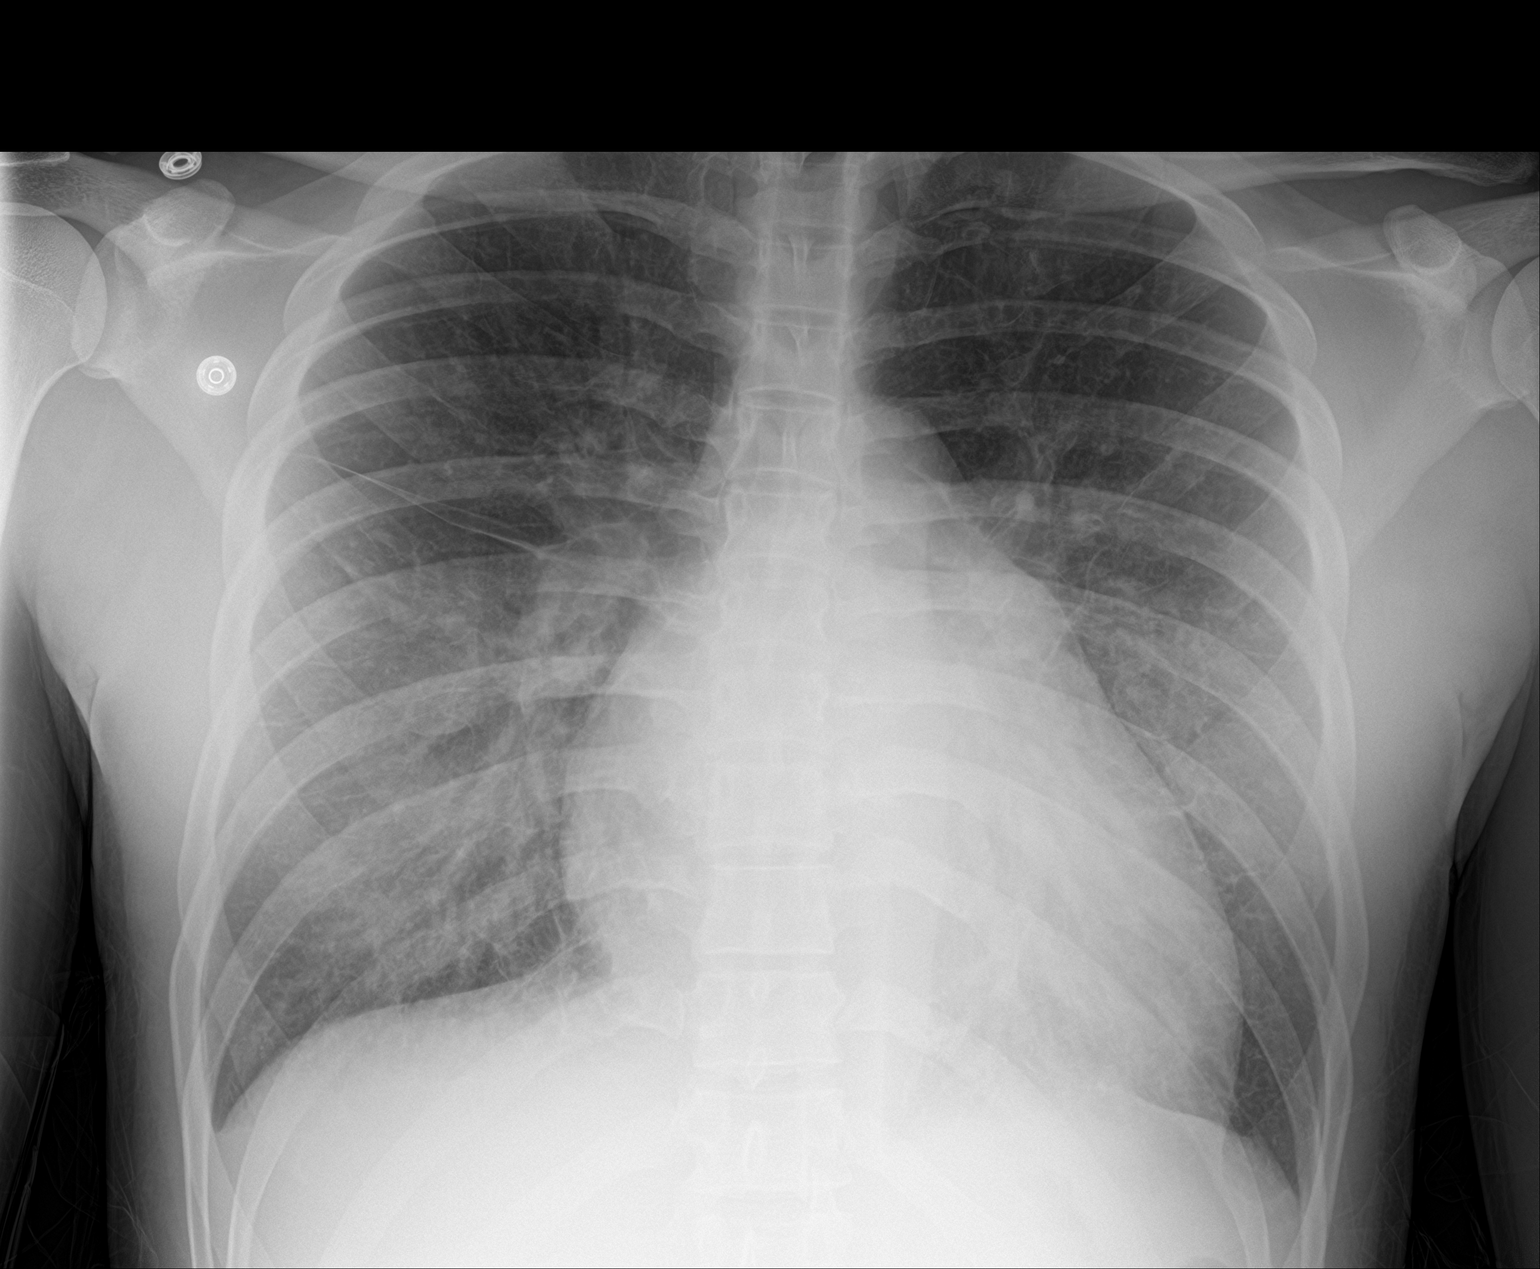

[2 of 2 positions shown; findings below may reference images not displayed]

FINDINGS: Cardiomegaly with vascular congestion and bilateral interstitial and
ground-glass opacity consistent with edema. No significant pleural
effusion. No pneumothorax.
IMPRESSION: Cardiomegaly with vascular congestion and diffuse bilateral
interstitial and ground-glass opacity consistent with pulmonary
edema

## 2021-05-30 ENCOUNTER — Other Ambulatory Visit (HOSPITAL_COMMUNITY): Payer: Self-pay

## 2021-06-17 ENCOUNTER — Telehealth (HOSPITAL_COMMUNITY): Payer: Self-pay | Admitting: Pharmacy Technician

## 2021-06-17 ENCOUNTER — Other Ambulatory Visit (HOSPITAL_COMMUNITY): Payer: Self-pay

## 2021-06-17 NOTE — Telephone Encounter (Signed)
Advanced Heart Failure Patient Advocate Encounter ? ?Spoke to patient regarding re-enrollment of Entresto assistance with [employer]. Patient is aware of the POI requirement. Will contact the office if he cannot find the application that was mailed to him.  ? ?Will fax in once all signatures are obtained.  ? ?Archer Asa, CPhT ? ? ?

## 2021-07-03 ENCOUNTER — Telehealth (HOSPITAL_COMMUNITY): Payer: Self-pay | Admitting: Internal Medicine

## 2021-07-24 ENCOUNTER — Other Ambulatory Visit (HOSPITAL_COMMUNITY): Payer: Self-pay

## 2021-07-24 MED ORDER — SACUBITRIL-VALSARTAN 97-103 MG PO TABS: 1.0000 | ORAL_TABLET | Freq: Two times a day (BID) | ORAL | 3 refills | Status: DC

## 2021-07-24 NOTE — Telephone Encounter (Signed)
Sent in application via fax along with POI. ? ?Will follow up. ? ?

## 2021-07-26 NOTE — Telephone Encounter (Signed)
Advanced Heart Failure Patient Advocate Encounter  ? ?Patient was approved to receive Entresto from [employer] ? ?Effective dates: 07/25/21 through 07/26/22 ?Document scanned to chart.  ? ?Archer Asa, CPhT ? ? ?

## 2021-08-01 ENCOUNTER — Other Ambulatory Visit (HOSPITAL_COMMUNITY): Payer: Self-pay

## 2021-08-01 ENCOUNTER — Other Ambulatory Visit (HOSPITAL_COMMUNITY): Payer: Self-pay | Admitting: Cardiology

## 2021-08-02 ENCOUNTER — Other Ambulatory Visit (HOSPITAL_COMMUNITY): Payer: Self-pay

## 2021-08-02 MED ORDER — SPIRONOLACTONE 25 MG PO TABS
25.0000 mg | ORAL_TABLET | Freq: Every day | ORAL | 3 refills | Status: DC
  Filled 2021-08-02: qty 30, 30d supply, fill #0
  Filled 2021-10-09: qty 30, 30d supply, fill #1
  Filled 2021-11-11: qty 30, 30d supply, fill #2
  Filled 2021-12-25: qty 30, 30d supply, fill #3

## 2021-09-30 ENCOUNTER — Other Ambulatory Visit (HOSPITAL_COMMUNITY): Payer: Self-pay

## 2021-09-30 ENCOUNTER — Other Ambulatory Visit (HOSPITAL_COMMUNITY): Payer: Self-pay | Admitting: Cardiology

## 2021-09-30 ENCOUNTER — Ambulatory Visit (HOSPITAL_COMMUNITY)
Admission: RE | Admit: 2021-09-30 | Discharge: 2021-09-30 | Disposition: A | Discharge status: Home / Self Care | Payer: Self-pay | Source: Ambulatory Visit | Attending: Internal Medicine | Admitting: Internal Medicine

## 2021-09-30 ENCOUNTER — Encounter (HOSPITAL_COMMUNITY): Payer: Self-pay | Admitting: Internal Medicine

## 2021-09-30 VITALS — BP 130/88 | HR 79 | Wt 192.6 lb

## 2021-09-30 DIAGNOSIS — I428 Other cardiomyopathies: Secondary | ICD-10-CM | POA: Insufficient documentation

## 2021-09-30 DIAGNOSIS — Z7984 Long term (current) use of oral hypoglycemic drugs: Secondary | ICD-10-CM | POA: Insufficient documentation

## 2021-09-30 DIAGNOSIS — I5022 Chronic systolic (congestive) heart failure: Secondary | ICD-10-CM | POA: Insufficient documentation

## 2021-09-30 DIAGNOSIS — F1011 Alcohol abuse, in remission: Secondary | ICD-10-CM | POA: Insufficient documentation

## 2021-09-30 DIAGNOSIS — Z87891 Personal history of nicotine dependence: Secondary | ICD-10-CM | POA: Insufficient documentation

## 2021-09-30 DIAGNOSIS — F1010 Alcohol abuse, uncomplicated: Secondary | ICD-10-CM

## 2021-09-30 DIAGNOSIS — Z79899 Other long term (current) drug therapy: Secondary | ICD-10-CM | POA: Insufficient documentation

## 2021-09-30 LAB — BASIC METABOLIC PANEL
Anion gap: 11 (ref 5–15)
BUN: 8 mg/dL (ref 6–20)
CO2: 23 mmol/L (ref 22–32)
Calcium: 9.2 mg/dL (ref 8.9–10.3)
Chloride: 106 mmol/L (ref 98–111)
Creatinine, Ser: 1.06 mg/dL (ref 0.61–1.24)
GFR, Estimated: >60 mL/min (ref >60)
Glucose, Bld: 106 mg/dL — ABNORMAL HIGH (ref 70–99)
Potassium: 4.2 mmol/L (ref 3.5–5.1)
Sodium: 140 mmol/L (ref 135–145)

## 2021-09-30 LAB — CBC
HCT: 44.4 % (ref 39.0–52.0)
Hemoglobin: 15.2 g/dL (ref 13.0–17.0)
MCH: 29.4 pg (ref 26.0–34.0)
MCHC: 34.2 g/dL (ref 30.0–36.0)
MCV: 85.9 fL (ref 80.0–100.0)
Platelets: 179 10*3/uL (ref 150–400)
RBC: 5.17 MIL/uL (ref 4.22–5.81)
RDW: 13.1 % (ref 11.5–15.5)
WBC: 7 10*3/uL (ref 4.0–10.5)
nRBC: 0 % (ref 0.0–0.2)

## 2021-09-30 LAB — BRAIN NATRIURETIC PEPTIDE: B Natriuretic Peptide: 26.4 pg/mL (ref 0.0–100.0)

## 2021-09-30 MED ORDER — DAPAGLIFLOZIN PROPANEDIOL 10 MG PO TABS
10.0000 mg | ORAL_TABLET | Freq: Every day | ORAL | 3 refills | Status: AC
  Filled 2021-09-30: qty 30, 30d supply, fill #0
  Filled 2021-11-11: qty 30, 30d supply, fill #1

## 2021-09-30 NOTE — Patient Instructions (Signed)
START Farxiga 10 mg one tab daily  Labs today We will only contact you if something comes back abnormal or we need to make some changes. Otherwise no news is good news!  Your physician wants you to follow-up in: 6 months with Dr Gala Romney. You will receive a reminder letter in the mail two months in advance. If you don't receive a letter, please call our office to schedule the follow-up appointment.    Do the following things EVERYDAY: Weigh yourself in the morning before breakfast. Write it down and keep it in a log. Take your medicines as prescribed Eat low salt foods--Limit salt (sodium) to 2000 mg per day.  Stay as active as you can everyday Limit all fluids for the day to less than 2 liters  At the [facility], you and your health needs are our priority. As part of our continuing mission to provide you with exceptional heart care, we have created designated Provider Care Teams. These Care Teams include your primary Cardiologist (physician) and Advanced Practice Providers (APPs- Physician Assistants and Nurse Practitioners) who all work together to provide you with the care you need, when you need it.   You may see any of the following providers on your designated Care Team at your next follow up: Dr Arvilla Meres Dr Carron Curie, NP Robbie Lis, [place] [facility] [place], [place] Karle Plumber, PharmD   Please be sure to bring in all your medications bottles to every appointment.   If you have any questions or concerns before your next appointment please send [place] a message through [place] or call our office at [phone].    TO LEAVE A MESSAGE FOR THE NURSE SELECT OPTION 2, PLEASE LEAVE A MESSAGE INCLUDING: YOUR NAME DATE OF BIRTH CALL BACK NUMBER REASON FOR CALL**this is important as we prioritize the call backs  YOU WILL RECEIVE A CALL BACK THE SAME DAY AS LONG AS YOU CALL BEFORE 4:00 PM

## 2021-09-30 NOTE — Progress Notes (Signed)
ReDS Vest / Clip - 09/30/21 1200       ReDS Vest / Clip   Station Marker C    Ruler Value 28    ReDS Value Range High volume overload    ReDS Actual Value 42

## 2021-09-30 NOTE — Progress Notes (Signed)
Advanced [facility] Clinic Note  Date:  09/30/2021   ID:  Bruce Morse, DOB 01/06/1991, MRN [id number]  Location: Home  Provider location: [place] [facility] Type of Visit: Established patient   PCP:  Grayce Sessions, NP  HF Cardiologist: Dr Gala Romney   Chief Complaint: Heart Failure    HPI:  Bruce Morse is a 31 y.o. male with a history of chronic systolic CHF, NICM, "congenital heart disease", ETOH abuse, Tobacco abuse, and THC use/abuse. Extensive history at [facility] work up was for the most part negative. He doesn't ever remember getting genetic testing, and thinks that the myocardial biopsy came back with "extra tissue" on the right side of his heart, but isn't sure about any more details   Admitted 3/5 - 06/02/18 with abdominal pain and HF. EKG with tachycardia and LVH. Echo showed new diagnosis of CHF with EF 20-25%. Taken for cath as below which showed EF 15% with normal coroanies and CI = 2.0 (MV sat 70%). HF meds adjusted as tolerated. He was not discharged on bb.   Echo 10/20/18 EF 35-40% RV normal.  Echo 11/20 EF 35-40%.    Last seen 01/2020. NYHA I, volume OK. Echo (11/21) EF 40-45%. Lost to follow up.  Today he returns for HF follow up. Last seen 11/21. Overall feeling fine. Working out at [employer] 2-3x/week for 30 minutes at a time. Has gained weight, feels abdomen is fuller. Denies palpitations, CP, dizziness, edema, or PND/Orthopnea. Appetite ok. No fever or chills. Weight at home 180 pounds. Taking all medications. Smokes 2-3 cigars/day. Lives with fiance, has 4 children. Rare ETOH use, no drugs. Plans to get on fiance's insurance after they're married in Oct 2023. 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  Cardiac Studies - Echo 01/25/20 EF 40-45%  - Cath 05/31/18 Ao = 89/68 (76)  LV =  90/9 RA = 2 RV = 22/1 PA = 17/9 (14) PCW = 7 Fick cardiac output/index = 3.8/2.0 PVR = 1.9 WU SVR = 1549 Ao sat = 99% PA sat = 69%, 72%   Assessment: 1. Normal coronary  arteries 2. Severe NICM EF 15% 3. Well compensated hemodynamics with moderately to severely reduced CO  Past Medical History:  Diagnosis Date   Alcohol abuse    Chronic systolic (congestive) heart failure (HCC)    Congenital heart disease    Marijuana abuse    Syncope    at age 62   Tobacco abuse    Past Surgical History:  Procedure Laterality Date   RIGHT/LEFT HEART CATH AND CORONARY ANGIOGRAPHY N/A 05/31/2018   Procedure: RIGHT/LEFT HEART CATH AND CORONARY ANGIOGRAPHY;  Surgeon: Dolores Patty, MD;  Location: [facility];  Service: Cardiovascular;  Laterality: N/A;   Current Outpatient Medications  Medication Sig Dispense Refill   carvedilol (COREG) 12.5 MG tablet Take 1 tablet (12.5 mg total) by mouth 2 (two) times daily. 60 tablet 3   sacubitril-valsartan (ENTRESTO) 97-103 MG Take 1 tablet by mouth 2 (two) times daily. 180 tablet 3   spironolactone (ALDACTONE) 25 MG tablet Take 1 tablet (25 mg total) by mouth daily. 30 tablet 3   No current facility-administered medications for this encounter.   Allergies:   Patient has no known allergies.   Social History:  The patient  reports that he quit smoking about 2 years ago. His smoking use included cigarettes. He has never used smokeless tobacco. He reports that he does not currently use alcohol. He reports that he does  not use drugs.   Family History:  The patient's family history is not on file.   ROS:  Please see the history of present illness.   All other systems are personally reviewed and negative.   Recent Labs: No results found for requested labs within last 365 days.  Personally reviewed   BP 130/88   Pulse 79   Wt 87.4 kg (192 lb 9.6 oz)   SpO2 98%   BMI 28.44 kg/m   Wt Readings from Last 3 Encounters:  09/30/21 87.4 kg (192 lb 9.6 oz)  01/25/20 80.9 kg (178 lb 6.4 oz)  06/17/19 83.1 kg (183 lb 3.2 oz)    Physical Exam General:  NAD. No resp difficulty HEENT: Normal Neck: Supple. No JVD.  Carotids 2+ bilat; no bruits. No lymphadenopathy or thryomegaly appreciated. Cor: PMI nondisplaced. Regular rate & rhythm. No rubs, gallops or murmurs. Lungs: Clear Abdomen: Firm, nontender, nondistended. No hepatosplenomegaly. No bruits or masses. Good bowel sounds. Extremities: No cyanosis, clubbing, rash, edema Neuro: Alert & oriented x 3, cranial nerves grossly intact. Moves all 4 extremities w/o difficulty. Affect pleasant.  ECG (personally reviewed): NSR 1st AVB, 81 bpm  ReDs: 42%  ASSESSMENT AND PLAN: 1. Chronic systolic heart failure, NICM - Normal coronaries by cath 05/31/2018 on cath EF 15%.  - Echo (05/28/2018): LVEF 20-25%, Mild LAE, Mod MR, Mod TR, Mild MR.  - Echo (10/11/18): EF 35-40% RV normal.  - Echo (02/16/19): EF 35-40%.  - Echo (01/25/20): EF 40-45%. - Doing well. NYHA I, volume looks OK on exam. REDs 42%, may be due to muscular upper body. - Start Farxiga 10 mg daily. Will need patient assistance. - Continue carvedilol 12.5 mg bid.  - Continue Entresto 97/103 mg bid. He has patient assistance. - Continue spiro 25 mg daily. - Defer echo as he is uninsured. - Needs cMRI once he has insurance +/- genetic counseling (getting married 12/28/21). - Labs today.  2. Congenital heart disease - Extensive work up at [facility] as above. No clear etiology - Will plan cMRI +/- genetic screening when he has insurance. - No change.   3. H/o NSVT - EF > 35%, LifeVest removed in 7/20. - Labs today.   4. ETOH abuse - Rare use now.   5. Tobacco abuse - Discussed cessation.  Follow up in 6 months.   Cydney Ok, FNP  09/30/2021 12:51 PM  [facility] [address] [facility] [place] [address] [phone] (office) [phone] (fax)  Patient seen and examined with the above-signed Advanced Practice Provider and/or Housestaff. I personally reviewed laboratory data, imaging studies and relevant notes. I  independently examined the patient and formulated the important aspects of the plan. I have edited the note to reflect any of my changes or salient points. I have personally discussed the plan with the patient and/or family.  Doing well. NYHA I-II. Last EF 40-45%. Volume status ok. Has cut down ETOH use.   General:  Well appearing. No resp difficulty HEENT: normal Neck: supple. no JVD. Carotids 2+ bilat; no bruits. No lymphadenopathy or thryomegaly appreciated. Cor: PMI nondisplaced. Regular rate & rhythm. No rubs, gallops or murmurs. Lungs: clear Abdomen: soft, nontender, nondistended. No hepatosplenomegaly. No bruits or masses. Good bowel sounds. Extremities: no cyanosis, clubbing, rash, edema Neuro: alert & orientedx3, cranial nerves grossly intact. moves all 4 extremities w/o difficulty. Affect pleasant  Stable NYHA I-II. Will add SGLT2i. Will need cMRI when he has insurance.  Arvilla Meres, MD  4:48 PM

## 2021-10-02 ENCOUNTER — Other Ambulatory Visit (HOSPITAL_COMMUNITY): Payer: Self-pay

## 2021-10-02 MED ORDER — CARVEDILOL 12.5 MG PO TABS
12.5000 mg | ORAL_TABLET | Freq: Two times a day (BID) | ORAL | 3 refills | Status: DC
  Filled 2021-10-02: qty 60, 30d supply, fill #0
  Filled 2021-12-04: qty 60, 30d supply, fill #1
  Filled 2022-02-05: qty 60, 30d supply, fill #2
  Filled 2022-03-13: qty 60, 30d supply, fill #3

## 2021-10-09 ENCOUNTER — Other Ambulatory Visit (HOSPITAL_COMMUNITY): Payer: Self-pay

## 2021-11-11 ENCOUNTER — Other Ambulatory Visit (HOSPITAL_COMMUNITY): Payer: Self-pay

## 2021-12-04 ENCOUNTER — Other Ambulatory Visit (HOSPITAL_COMMUNITY): Payer: Self-pay

## 2021-12-18 ENCOUNTER — Other Ambulatory Visit (HOSPITAL_COMMUNITY): Payer: Self-pay

## 2021-12-18 ENCOUNTER — Telehealth (HOSPITAL_COMMUNITY): Payer: Self-pay

## 2021-12-18 NOTE — Telephone Encounter (Signed)
Advanced Heart Failure Patient Advocate Encounter   Patient is currently uninsured.   In anticipation of changes to the HF Fund, reached out to patient about applying for [employer] Patient Assistance Program with [facility].   Sent docusign with [employer] for patient to complete.   Clista Bernhardt, CPhT Rx Patient Advocate Phone: [phone]

## 2021-12-25 ENCOUNTER — Other Ambulatory Visit (HOSPITAL_COMMUNITY): Payer: Self-pay

## 2021-12-30 NOTE — Telephone Encounter (Signed)
Advanced Heart Failure Patient Advocate Encounter  Attempt to call patient for update, DocuSign has not been completed. Voice mailbox is full, unable to leave message.  Clista Bernhardt, CPhT Rx Patient Advocate Phone: [phone]

## 2022-01-21 NOTE — Telephone Encounter (Addendum)
Advanced Heart Failure Patient Advocate Encounter   Attempt to call patient for update, DocuSign has not been completed. Voice mailbox is full, unable to leave message.  Third attempt to complete forms for medication assistance Voided DocuSign forms; will be available in the future for assistance if patient needs.  Clista Bernhardt, CPhT Rx Patient Advocate Phone: [phone]

## 2022-02-05 ENCOUNTER — Other Ambulatory Visit (HOSPITAL_COMMUNITY): Payer: Self-pay | Admitting: Cardiology

## 2022-02-05 ENCOUNTER — Other Ambulatory Visit (HOSPITAL_COMMUNITY): Payer: Self-pay

## 2022-02-05 MED ORDER — SPIRONOLACTONE 25 MG PO TABS
25.0000 mg | ORAL_TABLET | Freq: Every day | ORAL | 3 refills | Status: AC
  Filled 2022-02-05: qty 30, 30d supply, fill #0
  Filled 2022-03-13: qty 30, 30d supply, fill #1
  Filled 2022-05-09: qty 30, 30d supply, fill #2
  Filled 2022-07-21: qty 30, 30d supply, fill #3

## 2022-02-06 ENCOUNTER — Other Ambulatory Visit (HOSPITAL_COMMUNITY): Payer: Self-pay

## 2022-02-25 ENCOUNTER — Telehealth (HOSPITAL_COMMUNITY): Payer: Self-pay | Admitting: Licensed Clinical Social Worker

## 2022-02-25 NOTE — Telephone Encounter (Signed)
Clinical Social Worker reached out to pt to discuss potential insurance options for pt who has been utilizing the HF fund to get medications.  CSW unable to reach patient or leave VM.  CSW mailing out flyer to patient explaining new criteria and how to apply.  Burna Sis, LCSW Clinical Social Worker [facility] Desk#: [phone] Cell#: [phone]

## 2022-03-13 ENCOUNTER — Other Ambulatory Visit: Payer: Self-pay

## 2022-05-09 ENCOUNTER — Other Ambulatory Visit (HOSPITAL_COMMUNITY): Payer: Self-pay

## 2022-05-09 ENCOUNTER — Other Ambulatory Visit (HOSPITAL_COMMUNITY): Payer: Self-pay | Admitting: Cardiology

## 2022-05-09 ENCOUNTER — Other Ambulatory Visit (HOSPITAL_BASED_OUTPATIENT_CLINIC_OR_DEPARTMENT_OTHER): Payer: Self-pay

## 2022-05-09 MED ORDER — CARVEDILOL 12.5 MG PO TABS
12.5000 mg | ORAL_TABLET | Freq: Two times a day (BID) | ORAL | 3 refills | Status: AC
  Filled 2022-05-09: qty 60, 30d supply, fill #0
  Filled 2022-07-21: qty 60, 30d supply, fill #1

## 2022-06-12 ENCOUNTER — Encounter (HOSPITAL_COMMUNITY): Payer: Self-pay | Admitting: Pharmacy Technician

## 2022-06-12 ENCOUNTER — Telehealth (HOSPITAL_COMMUNITY): Payer: Self-pay | Admitting: Pharmacy Technician

## 2022-06-12 ENCOUNTER — Other Ambulatory Visit (HOSPITAL_COMMUNITY): Payer: Self-pay

## 2022-06-12 NOTE — Telephone Encounter (Signed)
Advanced Heart Failure Patient Advocate Encounter  Its time to renew the patient's [facility] assistance. Upon further review, the patient has active Pharmacist, community. Test claim for Entresto, $143 (30 days). Attempted to call patient, no way to leave vm. Will not seek renewal at this time. Farxiga 30 day co-pay, $135.  Will send patient mychart message with co-pay cards for both medications. Delene Loll will be $10, Farxiga $0.  Kristiann Noyce F Wendelyn Kiesling, CPhT

## 2022-07-04 ENCOUNTER — Other Ambulatory Visit: Payer: Self-pay | Admitting: Family Medicine

## 2022-07-04 MED ORDER — METRONIDAZOLE 500 MG PO TABS: 500.0000 mg | ORAL_TABLET | Freq: Two times a day (BID) | ORAL | 0 refills | Status: AC

## 2022-07-04 NOTE — Progress Notes (Signed)
EPT for trichomonas infection

## 2022-07-22 ENCOUNTER — Other Ambulatory Visit (HOSPITAL_COMMUNITY): Payer: Self-pay

## 2022-07-23 ENCOUNTER — Other Ambulatory Visit (HOSPITAL_COMMUNITY): Payer: Self-pay

## 2022-07-23 MED ORDER — SACUBITRIL-VALSARTAN 97-103 MG PO TABS: 1.0000 | ORAL_TABLET | Freq: Two times a day (BID) | ORAL | 1 refills | Status: AC

## 2022-08-01 ENCOUNTER — Other Ambulatory Visit (HOSPITAL_COMMUNITY): Payer: Self-pay

## 2023-07-31 ENCOUNTER — Ambulatory Visit: Payer: Self-pay | Admitting: Internal Medicine
